# Patient Record
Sex: Male | Born: 1940 | Race: White | Hispanic: No | Marital: Married | Smoking: Former smoker
Health system: Southern US, Community
[De-identification: ages and names within clinical notes are randomized; demographics above are authoritative.]

## PROBLEM LIST (undated history)

## (undated) DIAGNOSIS — R413 Other amnesia: Principal | ICD-10-CM

## (undated) DIAGNOSIS — Z8679 Personal history of other diseases of the circulatory system: Secondary | ICD-10-CM

## (undated) DIAGNOSIS — N200 Calculus of kidney: Secondary | ICD-10-CM

## (undated) DIAGNOSIS — R918 Other nonspecific abnormal finding of lung field: Secondary | ICD-10-CM

## (undated) DIAGNOSIS — R269 Unspecified abnormalities of gait and mobility: Secondary | ICD-10-CM

## (undated) DIAGNOSIS — Z86010 Personal history of colonic polyps: Secondary | ICD-10-CM

## (undated) DIAGNOSIS — D126 Benign neoplasm of colon, unspecified: Secondary | ICD-10-CM

## (undated) HISTORY — DX: Unspecified abnormalities of gait and mobility: R26.9

## (undated) HISTORY — DX: Other amnesia: R41.3

## (undated) HISTORY — DX: Personal history of colonic polyps: Z86.010

## (undated) HISTORY — DX: Personal history of other diseases of the circulatory system: Z86.79

## (undated) HISTORY — DX: Benign neoplasm of colon, unspecified: D12.6

## (undated) HISTORY — PX: VARICOSE VEIN SURGERY: SHX832

---

## 2007-04-09 ENCOUNTER — Ambulatory Visit: Payer: Self-pay | Admitting: Internal Medicine

## 2007-04-23 ENCOUNTER — Ambulatory Visit: Payer: Self-pay | Admitting: Internal Medicine

## 2007-04-23 ENCOUNTER — Encounter: Payer: Self-pay | Admitting: Internal Medicine

## 2011-03-13 ENCOUNTER — Emergency Department (HOSPITAL_COMMUNITY)
Admission: EM | Admit: 2011-03-13 | Discharge: 2011-03-13 | Disposition: A | Discharge status: Home / Self Care | Payer: Medicare Other | Attending: Emergency Medicine | Admitting: Emergency Medicine

## 2011-03-13 ENCOUNTER — Emergency Department (HOSPITAL_COMMUNITY): Payer: Medicare Other

## 2011-03-13 DIAGNOSIS — R109 Unspecified abdominal pain: Secondary | ICD-10-CM | POA: Insufficient documentation

## 2011-03-13 DIAGNOSIS — R911 Solitary pulmonary nodule: Secondary | ICD-10-CM | POA: Insufficient documentation

## 2011-03-13 DIAGNOSIS — N200 Calculus of kidney: Secondary | ICD-10-CM | POA: Insufficient documentation

## 2011-03-13 DIAGNOSIS — N201 Calculus of ureter: Secondary | ICD-10-CM | POA: Insufficient documentation

## 2011-03-13 DIAGNOSIS — R0682 Tachypnea, not elsewhere classified: Secondary | ICD-10-CM | POA: Insufficient documentation

## 2011-03-13 LAB — URINALYSIS, ROUTINE W REFLEX MICROSCOPIC
Bilirubin Urine: NEGATIVE
Glucose, UA: NEGATIVE mg/dL
Ketones, ur: NEGATIVE mg/dL
Nitrite: NEGATIVE
Protein, ur: NEGATIVE mg/dL
Specific Gravity, Urine: 1.021 (ref 1.005–1.030)
Urobilinogen, UA: 0.2 mg/dL (ref 0.0–1.0)
pH: 7 (ref 5.0–8.0)

## 2011-03-13 LAB — URINE MICROSCOPIC-ADD ON

## 2011-03-13 LAB — POCT I-STAT, CHEM 8
BUN: 18 mg/dL (ref 6–23)
Calcium, Ion: 1.15 mmol/L (ref 1.12–1.32)
Chloride: 106 mEq/L (ref 96–112)
Creatinine, Ser: 1.2 mg/dL (ref 0.50–1.35)
Glucose, Bld: 142 mg/dL — ABNORMAL HIGH (ref 70–99)
HCT: 50 % (ref 39.0–52.0)
Hemoglobin: 17 g/dL (ref 13.0–17.0)
Potassium: 4.2 meq/L (ref 3.5–5.1)
Sodium: 142 meq/L (ref 135–145)
TCO2: 25 mmol/L (ref 0–100)

## 2011-03-14 LAB — URINE CULTURE
Colony Count: NO GROWTH
Culture  Setup Time: 201208301400
Culture: NO GROWTH

## 2011-04-28 ENCOUNTER — Telehealth: Payer: Self-pay | Admitting: Internal Medicine

## 2011-04-28 NOTE — Telephone Encounter (Signed)
Patient hasn't been seen here since 04/23/2007. Colon showed 5mm descending colon polyp, diverticulosis and internal hemorrhoids .Patient with a recent history of kidney stone.   He has not passed the kidney stone as far as his wife knows.  He developed some fever, side pain, loss of appetite, and rectal bleeding over the weekend.  Fever is low grade 100.2.  He has cloudy urine and blood in his urine also.  The blood in the stools has been a very small amount and intermittent.  The patient's wife would like him to come and see Dr. Leone Payor for all the above.  I have given him an appt for 05/02/11 8:30 with Dr. Leone Payor for rectal bleeding and he is to see his primary care in the interim to rule out non- GI causes for his other symptoms.  The patient's wife will contact his primary care Dr. Tenny Craw this am for an appt.

## 2011-05-22 ENCOUNTER — Ambulatory Visit: Payer: BLUE CROSS/BLUE SHIELD | Admitting: Internal Medicine

## 2011-11-12 DIAGNOSIS — Z8679 Personal history of other diseases of the circulatory system: Secondary | ICD-10-CM

## 2011-11-12 HISTORY — DX: Personal history of other diseases of the circulatory system: Z86.79

## 2011-11-17 ENCOUNTER — Ambulatory Visit
Admission: RE | Admit: 2011-11-17 | Discharge: 2011-11-17 | Disposition: A | Discharge status: Home / Self Care | Payer: BLUE CROSS/BLUE SHIELD | Source: Ambulatory Visit | Attending: Family Medicine | Admitting: Family Medicine

## 2011-11-17 ENCOUNTER — Other Ambulatory Visit: Payer: Self-pay | Admitting: Family Medicine

## 2011-11-17 DIAGNOSIS — R9389 Abnormal findings on diagnostic imaging of other specified body structures: Secondary | ICD-10-CM

## 2011-11-17 MED ORDER — IOHEXOL 350 MG/ML SOLN
125.0000 mL | Freq: Once | INTRAVENOUS | Status: AC | PRN
  Administered 2011-11-17: 125 mL via INTRAVENOUS

## 2011-11-18 ENCOUNTER — Other Ambulatory Visit: Payer: Self-pay | Admitting: Family Medicine

## 2011-11-18 ENCOUNTER — Ambulatory Visit
Admission: RE | Admit: 2011-11-18 | Discharge: 2011-11-18 | Disposition: A | Discharge status: Home / Self Care | Payer: Medicare Other | Source: Ambulatory Visit | Attending: Family Medicine | Admitting: Family Medicine

## 2011-11-18 DIAGNOSIS — R0781 Pleurodynia: Secondary | ICD-10-CM

## 2011-11-18 MED ORDER — IOHEXOL 300 MG/ML  SOLN
100.0000 mL | Freq: Once | INTRAMUSCULAR | Status: AC | PRN
  Administered 2011-11-18: 100 mL via INTRAVENOUS

## 2011-11-19 ENCOUNTER — Other Ambulatory Visit (HOSPITAL_COMMUNITY): Payer: Self-pay | Admitting: Family Medicine

## 2011-11-19 DIAGNOSIS — R9389 Abnormal findings on diagnostic imaging of other specified body structures: Secondary | ICD-10-CM

## 2011-11-19 DIAGNOSIS — R918 Other nonspecific abnormal finding of lung field: Secondary | ICD-10-CM

## 2011-11-21 ENCOUNTER — Emergency Department (HOSPITAL_COMMUNITY): Payer: Medicare Other

## 2011-11-21 ENCOUNTER — Inpatient Hospital Stay (HOSPITAL_COMMUNITY)
Admission: EM | Admit: 2011-11-21 | Discharge: 2011-12-10 | DRG: 542 | Disposition: A | Discharge status: Home / Self Care | Payer: Medicare Other | Attending: Pulmonary Disease | Admitting: Pulmonary Disease

## 2011-11-21 ENCOUNTER — Encounter (HOSPITAL_COMMUNITY): Payer: Self-pay | Admitting: *Deleted

## 2011-11-21 ENCOUNTER — Inpatient Hospital Stay (HOSPITAL_COMMUNITY): Payer: Medicare Other

## 2011-11-21 DIAGNOSIS — R918 Other nonspecific abnormal finding of lung field: Secondary | ICD-10-CM | POA: Diagnosis present

## 2011-11-21 DIAGNOSIS — M1710 Unilateral primary osteoarthritis, unspecified knee: Secondary | ICD-10-CM | POA: Diagnosis present

## 2011-11-21 DIAGNOSIS — D649 Anemia, unspecified: Secondary | ICD-10-CM

## 2011-11-21 DIAGNOSIS — R531 Weakness: Secondary | ICD-10-CM

## 2011-11-21 DIAGNOSIS — R5381 Other malaise: Secondary | ICD-10-CM

## 2011-11-21 DIAGNOSIS — R7309 Other abnormal glucose: Secondary | ICD-10-CM | POA: Diagnosis not present

## 2011-11-21 DIAGNOSIS — T380X5A Adverse effect of glucocorticoids and synthetic analogues, initial encounter: Secondary | ICD-10-CM | POA: Diagnosis not present

## 2011-11-21 DIAGNOSIS — R091 Pleurisy: Secondary | ICD-10-CM | POA: Diagnosis present

## 2011-11-21 DIAGNOSIS — R5383 Other fatigue: Secondary | ICD-10-CM | POA: Diagnosis present

## 2011-11-21 DIAGNOSIS — M130 Polyarthritis, unspecified: Secondary | ICD-10-CM

## 2011-11-21 DIAGNOSIS — N201 Calculus of ureter: Secondary | ICD-10-CM | POA: Diagnosis present

## 2011-11-21 DIAGNOSIS — N139 Obstructive and reflux uropathy, unspecified: Secondary | ICD-10-CM | POA: Diagnosis present

## 2011-11-21 DIAGNOSIS — M3130 Wegener's granulomatosis without renal involvement: Principal | ICD-10-CM | POA: Diagnosis present

## 2011-11-21 DIAGNOSIS — R351 Nocturia: Secondary | ICD-10-CM | POA: Diagnosis present

## 2011-11-21 DIAGNOSIS — K1370 Unspecified lesions of oral mucosa: Secondary | ICD-10-CM | POA: Diagnosis present

## 2011-11-21 DIAGNOSIS — C799 Secondary malignant neoplasm of unspecified site: Secondary | ICD-10-CM | POA: Diagnosis present

## 2011-11-21 DIAGNOSIS — E876 Hypokalemia: Secondary | ICD-10-CM | POA: Diagnosis not present

## 2011-11-21 DIAGNOSIS — J9600 Acute respiratory failure, unspecified whether with hypoxia or hypercapnia: Secondary | ICD-10-CM | POA: Diagnosis not present

## 2011-11-21 DIAGNOSIS — J95811 Postprocedural pneumothorax: Secondary | ICD-10-CM | POA: Diagnosis not present

## 2011-11-21 DIAGNOSIS — J159 Unspecified bacterial pneumonia: Secondary | ICD-10-CM

## 2011-11-21 DIAGNOSIS — Y849 Medical procedure, unspecified as the cause of abnormal reaction of the patient, or of later complication, without mention of misadventure at the time of the procedure: Secondary | ICD-10-CM | POA: Diagnosis not present

## 2011-11-21 DIAGNOSIS — E46 Unspecified protein-calorie malnutrition: Secondary | ICD-10-CM | POA: Diagnosis present

## 2011-11-21 DIAGNOSIS — K5900 Constipation, unspecified: Secondary | ICD-10-CM | POA: Diagnosis present

## 2011-11-21 DIAGNOSIS — B370 Candidal stomatitis: Secondary | ICD-10-CM | POA: Diagnosis not present

## 2011-11-21 DIAGNOSIS — D473 Essential (hemorrhagic) thrombocythemia: Secondary | ICD-10-CM | POA: Diagnosis present

## 2011-11-21 DIAGNOSIS — M2550 Pain in unspecified joint: Secondary | ICD-10-CM | POA: Diagnosis present

## 2011-11-21 DIAGNOSIS — Y9210 Unspecified residential institution as the place of occurrence of the external cause: Secondary | ICD-10-CM | POA: Diagnosis not present

## 2011-11-21 DIAGNOSIS — D72829 Elevated white blood cell count, unspecified: Secondary | ICD-10-CM | POA: Diagnosis present

## 2011-11-21 DIAGNOSIS — Z87891 Personal history of nicotine dependence: Secondary | ICD-10-CM

## 2011-11-21 DIAGNOSIS — R61 Generalized hyperhidrosis: Secondary | ICD-10-CM | POA: Diagnosis present

## 2011-11-21 DIAGNOSIS — K121 Other forms of stomatitis: Secondary | ICD-10-CM

## 2011-11-21 HISTORY — DX: Other nonspecific abnormal finding of lung field: R91.8

## 2011-11-21 HISTORY — DX: Calculus of kidney: N20.0

## 2011-11-21 LAB — CBC
HCT: 38.9 % — ABNORMAL LOW (ref 39.0–52.0)
Hemoglobin: 12.9 g/dL — ABNORMAL LOW (ref 13.0–17.0)
MCH: 29.9 pg (ref 26.0–34.0)
RBC: 4.31 MIL/uL (ref 4.22–5.81)

## 2011-11-21 LAB — URINALYSIS, ROUTINE W REFLEX MICROSCOPIC
Leukocytes, UA: NEGATIVE
Nitrite: NEGATIVE
Protein, ur: 30 mg/dL — AB

## 2011-11-21 LAB — DIFFERENTIAL
Lymphs Abs: 1 10*3/uL (ref 0.7–4.0)
Monocytes Relative: 4 % (ref 3–12)
Neutro Abs: 6.5 10*3/uL (ref 1.7–7.7)
Neutrophils Relative %: 81 % — ABNORMAL HIGH (ref 43–77)

## 2011-11-21 LAB — CARDIAC PANEL(CRET KIN+CKTOT+MB+TROPI)
Relative Index: INVALID (ref 0.0–2.5)
Total CK: 62 U/L (ref 7–232)

## 2011-11-21 LAB — COMPREHENSIVE METABOLIC PANEL
Alkaline Phosphatase: 204 U/L — ABNORMAL HIGH (ref 39–117)
BUN: 16 mg/dL (ref 6–23)
Chloride: 97 mEq/L (ref 96–112)
GFR calc Af Amer: >90 mL/min (ref >90)
GFR calc non Af Amer: 81 mL/min — ABNORMAL LOW (ref >90)
Glucose, Bld: 202 mg/dL — ABNORMAL HIGH (ref 70–99)
Potassium: 3.9 mEq/L (ref 3.5–5.1)
Total Bilirubin: 0.5 mg/dL (ref 0.3–1.2)

## 2011-11-21 LAB — URIC ACID: Uric Acid, Serum: 3.4 mg/dL — ABNORMAL LOW (ref 4.0–7.8)

## 2011-11-21 LAB — URINE MICROSCOPIC-ADD ON

## 2011-11-21 MED ORDER — ONDANSETRON HCL 4 MG/2ML IJ SOLN: 4.0000 mg | Freq: Four times a day (QID) | INTRAMUSCULAR | Status: DC | PRN

## 2011-11-21 MED ORDER — ALBUTEROL SULFATE (5 MG/ML) 0.5% IN NEBU: 2.5000 mg | INHALATION_SOLUTION | Freq: Four times a day (QID) | RESPIRATORY_TRACT | Status: DC

## 2011-11-21 MED ORDER — ENOXAPARIN SODIUM 40 MG/0.4ML ~~LOC~~ SOLN
40.0000 mg | SUBCUTANEOUS | Status: DC
  Filled 2011-11-21: qty 0.4

## 2011-11-21 MED ORDER — PIPERACILLIN-TAZOBACTAM 3.375 G IVPB
3.3750 g | Freq: Three times a day (TID) | INTRAVENOUS | Status: DC
  Administered 2011-11-21 – 2011-11-29 (×22): 3.375 g via INTRAVENOUS
  Filled 2011-11-21 (×28): qty 50

## 2011-11-21 MED ORDER — HYDROMORPHONE HCL PF 1 MG/ML IJ SOLN
1.0000 mg | Freq: Once | INTRAMUSCULAR | Status: AC
  Administered 2011-11-21: 1 mg via INTRAVENOUS
  Filled 2011-11-21: qty 1

## 2011-11-21 MED ORDER — ALBUTEROL SULFATE (5 MG/ML) 0.5% IN NEBU
2.5000 mg | INHALATION_SOLUTION | Freq: Three times a day (TID) | RESPIRATORY_TRACT | Status: DC
  Administered 2011-11-21 – 2011-11-24 (×9): 2.5 mg via RESPIRATORY_TRACT
  Filled 2011-11-21 (×9): qty 0.5

## 2011-11-21 MED ORDER — IPRATROPIUM BROMIDE 0.02 % IN SOLN
0.5000 mg | Freq: Three times a day (TID) | RESPIRATORY_TRACT | Status: DC
  Administered 2011-11-21 – 2011-11-24 (×9): 0.5 mg via RESPIRATORY_TRACT
  Filled 2011-11-21 (×9): qty 2.5

## 2011-11-21 MED ORDER — TAMSULOSIN HCL 0.4 MG PO CAPS
0.4000 mg | ORAL_CAPSULE | Freq: Every day | ORAL | Status: DC
  Administered 2011-11-21 – 2011-11-28 (×8): 0.4 mg via ORAL
  Filled 2011-11-21 (×8): qty 1

## 2011-11-21 MED ORDER — VANCOMYCIN HCL IN DEXTROSE 1-5 GM/200ML-% IV SOLN
1000.0000 mg | INTRAVENOUS | Status: AC
  Administered 2011-11-21: 1000 mg via INTRAVENOUS
  Filled 2011-11-21: qty 200

## 2011-11-21 MED ORDER — HYDROMORPHONE HCL PF 1 MG/ML IJ SOLN
0.5000 mg | INTRAMUSCULAR | Status: DC | PRN
  Administered 2011-11-21 – 2011-11-24 (×12): 0.5 mg via INTRAVENOUS
  Filled 2011-11-21 (×13): qty 1

## 2011-11-21 MED ORDER — ACETAMINOPHEN 650 MG RE SUPP: 650.0000 mg | Freq: Four times a day (QID) | RECTAL | Status: DC | PRN

## 2011-11-21 MED ORDER — ONDANSETRON HCL 4 MG PO TABS: 4.0000 mg | ORAL_TABLET | Freq: Four times a day (QID) | ORAL | Status: DC | PRN

## 2011-11-21 MED ORDER — VANCOMYCIN HCL 1000 MG IV SOLR
1250.0000 mg | Freq: Two times a day (BID) | INTRAVENOUS | Status: DC
  Administered 2011-11-21 – 2011-11-24 (×5): 1250 mg via INTRAVENOUS
  Filled 2011-11-21 (×7): qty 1250

## 2011-11-21 MED ORDER — SODIUM CHLORIDE 0.9 % IV SOLN
INTRAVENOUS | Status: DC
  Administered 2011-11-21: 999 mL via INTRAVENOUS
  Administered 2011-11-21 – 2011-11-24 (×3): via INTRAVENOUS

## 2011-11-21 MED ORDER — ALBUTEROL SULFATE HFA 108 (90 BASE) MCG/ACT IN AERS
2.0000 | INHALATION_SPRAY | Freq: Four times a day (QID) | RESPIRATORY_TRACT | Status: DC
  Filled 2011-11-21: qty 6.7

## 2011-11-21 MED ORDER — HYDROCODONE-ACETAMINOPHEN 5-325 MG PO TABS
1.0000 | ORAL_TABLET | ORAL | Status: DC | PRN
  Administered 2011-11-21 – 2011-11-23 (×9): 2 via ORAL
  Filled 2011-11-21 (×10): qty 2

## 2011-11-21 MED ORDER — DOCUSATE SODIUM 100 MG PO CAPS
100.0000 mg | ORAL_CAPSULE | Freq: Two times a day (BID) | ORAL | Status: DC
  Administered 2011-11-21 – 2011-11-28 (×10): 100 mg via ORAL
  Filled 2011-11-21 (×15): qty 1

## 2011-11-21 MED ORDER — PIPERACILLIN-TAZOBACTAM 3.375 G IVPB
3.3750 g | INTRAVENOUS | Status: AC
  Administered 2011-11-21: 3.375 g via INTRAVENOUS
  Filled 2011-11-21: qty 50

## 2011-11-21 MED ORDER — ONDANSETRON HCL 4 MG/2ML IJ SOLN
4.0000 mg | Freq: Once | INTRAMUSCULAR | Status: AC
  Administered 2011-11-21: 4 mg via INTRAVENOUS
  Filled 2011-11-21: qty 2

## 2011-11-21 MED ORDER — ENOXAPARIN SODIUM 40 MG/0.4ML ~~LOC~~ SOLN
40.0000 mg | SUBCUTANEOUS | Status: DC
  Administered 2011-11-21 – 2011-11-27 (×7): 40 mg via SUBCUTANEOUS
  Filled 2011-11-21 (×8): qty 0.4

## 2011-11-21 MED ORDER — HYDROMORPHONE HCL PF 1 MG/ML IJ SOLN
INTRAMUSCULAR | Status: AC
  Administered 2011-11-21: 0.5 mg
  Filled 2011-11-21: qty 1

## 2011-11-21 MED ORDER — ACETAMINOPHEN 325 MG PO TABS
650.0000 mg | ORAL_TABLET | Freq: Four times a day (QID) | ORAL | Status: DC | PRN
  Administered 2011-11-24: 650 mg via ORAL
  Filled 2011-11-21: qty 2

## 2011-11-21 MED ORDER — SENNA 8.6 MG PO TABS
1.0000 | ORAL_TABLET | Freq: Two times a day (BID) | ORAL | Status: DC
  Administered 2011-11-21 – 2011-12-10 (×27): 8.6 mg via ORAL
  Filled 2011-11-21 (×38): qty 1

## 2011-11-21 MED ORDER — IPRATROPIUM BROMIDE 0.02 % IN SOLN: 0.5000 mg | Freq: Four times a day (QID) | RESPIRATORY_TRACT | Status: DC

## 2011-11-21 MED ORDER — MORPHINE SULFATE 2 MG/ML IJ SOLN
2.0000 mg | Freq: Once | INTRAMUSCULAR | Status: AC
  Administered 2011-11-21: 2 mg via INTRAVENOUS
  Filled 2011-11-21: qty 1

## 2011-11-21 MED ORDER — DEXTROSE 5 % IV SOLN
100.0000 mg | Freq: Two times a day (BID) | INTRAVENOUS | Status: DC
  Administered 2011-11-21 – 2011-11-23 (×4): 100 mg via INTRAVENOUS
  Filled 2011-11-21 (×5): qty 100

## 2011-11-21 NOTE — Consult Note (Signed)
Urology Consult   Physician requesting consult: Hospital service  Reason for consult: Left-sided ureteral stone  History of Present Illness: Robert Mcgee is a 71 y.o. male who is admitted for multiple medical issues. 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 He does have a history of urolithiasis, having had a stone episode in the fall of 2011. On CT scan, the patient was found to have a left distal ureteral stone with hydronephrosis. He has not been having pain recently. He is no significant change in lower urinary tract symptomatology or gross hematuria. He is not sure if he passed a stone a year and half ago, but he was symptomatic for a few days.  He presented to the emergency room today with multiple complaints including hip pain, chest pain and myalgias/arthralgias. Evaluation included chest CT.  He denies a history of voiding or storage urinary symptoms, hematuria, UTIs, STDs, urolithiasis, GU malignancy/trauma/surgery.  Past Medical History  Diagnosis Date  . Kidney stone   . Pneumothorax     Past Surgical History  Procedure Date  . Varicose vein surgery     Medications: Scheduled Meds:   . albuterol  2.5 mg Nebulization Q6H  . docusate sodium  100 mg Oral BID  . doxycycline (VIBRAMYCIN) IV  100 mg Intravenous Q12H  . enoxaparin (LOVENOX) injection  40 mg Subcutaneous Q24H  . HYDROmorphone      . HYDROmorphone  1 mg Intravenous Once  . ipratropium  0.5 mg Nebulization Q6H  .  morphine injection  2 mg Intravenous Once  . ondansetron (ZOFRAN) IV  4 mg Intravenous Once  . piperacillin-tazobactam (ZOSYN)  IV  3.375 g Intravenous To ER  . piperacillin-tazobactam (ZOSYN)  IV  3.375 g Intravenous Q8H  . senna  1 tablet Oral BID  . vancomycin  1,250 mg Intravenous Q12H  . vancomycin  1,000 mg Intravenous To ER  . DISCONTD: albuterol  2 puff Inhalation QID  . DISCONTD: enoxaparin  40 mg Subcutaneous Q24H   Continuous Infusions:   . sodium chloride 20 mL/hr at  11/21/11 1335   PRN Meds:.acetaminophen, acetaminophen, HYDROcodone-acetaminophen, HYDROmorphone (DILAUDID) injection, ondansetron (ZOFRAN) IV, ondansetron  Allergies: No Known Allergies  History reviewed. No pertinent family history.  Social History:  reports that he quit smoking about 31 years ago. He has never used smokeless tobacco. He reports that he does not drink alcohol or use illicit drugs.  ROS: A complete review of systems was performed.  All systems are negative except for pertinent findings as noted.  Physical Exam:  Vital signs in last 24 hours: Temp:  [98.1 F (36.7 C)-98.3 F (36.8 C)] 98.3 F (36.8 C) (05/10 1637) Pulse Rate:  [76-100] 100  (05/10 1637) Resp:  [16-18] 18  (05/10 1637) BP: (128-177)/(47-80) 177/80 mmHg (05/10 1637) SpO2:  [91 %-100 %] 94 % (05/10 1637) Weight:  [97.523 kg (215 lb)] 97.523 kg (215 lb) (05/10 0913) General:  Alert and oriented, No acute distress HEENT: Normocephalic, atraumatic Neck: No JVD or lymphadenopathy  Neurologic: Grossly intact  Laboratory Data:   [facility] 11/21/11 0950  WBC 8.0  HGB 12.9*  HCT 38.9*  PLT 474*     Basename 11/21/11 0950  NA 134*  K 3.9  CL 97  GLUCOSE 202*  BUN 16  CALCIUM 9.3  CREATININE 0.97     Results for orders placed during the hospital encounter of 11/21/11 (from the past 24 hour(s))  CBC     Status: Abnormal   Collection Time  11/21/11  9:50 AM      Component Value Range   WBC 8.0  4.0 - 10.5 (K/uL)   RBC 4.31  4.22 - 5.81 (MIL/uL)   Hemoglobin 12.9 (*) 13.0 - 17.0 (g/dL)   HCT 08.6 (*) 57.8 - 52.0 (%)   MCV 90.3  78.0 - 100.0 (fL)   MCH 29.9  26.0 - 34.0 (pg)   MCHC 33.2  30.0 - 36.0 (g/dL)   RDW 46.9  62.9 - 52.8 (%)   Platelets 474 (*) 150 - 400 (K/uL)  DIFFERENTIAL     Status: Abnormal   Collection Time   11/21/11  9:50 AM      Component Value Range   Neutrophils Relative 81 (*) 43 - 77 (%)   Neutro Abs 6.5  1.7 - 7.7 (K/uL)   Lymphocytes Relative 12  12 - 46 (%)    Lymphs Abs 1.0  0.7 - 4.0 (K/uL)   Monocytes Relative 4  3 - 12 (%)   Monocytes Absolute 0.3  0.1 - 1.0 (K/uL)   Eosinophils Relative 4  0 - 5 (%)   Eosinophils Absolute 0.3  0.0 - 0.7 (K/uL)   Basophils Relative 0  0 - 1 (%)   Basophils Absolute 0.0  0.0 - 0.1 (K/uL)  COMPREHENSIVE METABOLIC PANEL     Status: Abnormal   Collection Time   11/21/11  9:50 AM      Component Value Range   Sodium 134 (*) 135 - 145 (mEq/L)   Potassium 3.9  3.5 - 5.1 (mEq/L)   Chloride 97  96 - 112 (mEq/L)   CO2 24  19 - 32 (mEq/L)   Glucose, Bld 202 (*) 70 - 99 (mg/dL)   BUN 16  6 - 23 (mg/dL)   Creatinine, Ser 4.13  0.50 - 1.35 (mg/dL)   Calcium 9.3  8.4 - 24.4 (mg/dL)   Total Protein 7.2  6.0 - 8.3 (g/dL)   Albumin 2.3 (*) 3.5 - 5.2 (g/dL)   AST 40 (*) 0 - 37 (U/L)   ALT 51  0 - 53 (U/L)   Alkaline Phosphatase 204 (*) 39 - 117 (U/L)   Total Bilirubin 0.5  0.3 - 1.2 (mg/dL)   GFR calc non Af Amer 81 (*) >90 (mL/min)   GFR calc Af Amer >90  >90 (mL/min)  TROPONIN I     Status: Normal   Collection Time   11/21/11  9:50 AM      Component Value Range   Troponin I <0.30  <0.30 (ng/mL)  SEDIMENTATION RATE     Status: Abnormal   Collection Time   11/21/11  9:50 AM      Component Value Range   Sed Rate 69 (*) 0 - 16 (mm/hr)  URINALYSIS, ROUTINE W REFLEX MICROSCOPIC     Status: Abnormal   Collection Time   11/21/11 12:08 PM      Component Value Range   Color, Urine AMBER (*) YELLOW    APPearance CLEAR  CLEAR    Specific Gravity, Urine 1.023  1.005 - 1.030    pH 6.5  5.0 - 8.0    Glucose, UA NEGATIVE  NEGATIVE (mg/dL)   Hgb urine dipstick SMALL (*) NEGATIVE    Bilirubin Urine SMALL (*) NEGATIVE    Ketones, ur TRACE (*) NEGATIVE (mg/dL)   Protein, ur 30 (*) NEGATIVE (mg/dL)   Urobilinogen, UA 1.0  0.0 - 1.0 (mg/dL)   Nitrite NEGATIVE  NEGATIVE    Leukocytes, UA NEGATIVE  NEGATIVE   URINE MICROSCOPIC-ADD ON     Status: Normal   Collection Time   11/21/11 12:08 PM      Component Value Range    WBC, UA 3-6  <3 (WBC/hpf)   RBC / HPF 3-6  <3 (RBC/hpf)   Urine-Other MUCOUS PRESENT    LACTATE DEHYDROGENASE     Status: Normal   Collection Time   11/21/11  1:30 PM      Component Value Range   LDH 181  94 - 250 (U/L)  PROTIME-INR     Status: Normal   Collection Time   11/21/11  5:00 PM      Component Value Range   Prothrombin Time 15.2  11.6 - 15.2 (seconds)   INR 1.18  0.00 - 1.49    No results found for this or any previous visit (from the past 240 hour(s)).  Renal Function:  Basename 11/21/11 0950  CREATININE 0.97   Estimated Creatinine Clearance: 81.2 ml/min (by C-G formula based on Cr of 0.97).  Radiologic Imaging: Dg Chest 2 View  11/21/2011  *RADIOLOGY REPORT*  Clinical Data: Shortness of breath for 8 days, fever, weakness, loss of appetite, former smoker  CHEST - 2 VIEW  Comparison: 11/17/2011 Correlation:  CT chest 11/17/2011  Findings: Normal heart size, mediastinal contours and pulmonary vascularity. Bronchitic changes. Interstitial changes in mid-to-lower lungs with bibasilar atelectasis. Patchy nodular foci are seen in both lungs corresponding to pulmonary nodules identified on the preceding CT. No pleural effusion or pneumothorax. Diffuse idiopathic skeletal hyperostosis. Minimal end plate spur formation thoracic spine.  IMPRESSION: Bronchitic changes with interstitial changes in the mid-to-lower lungs and coexistent bibasilar atelectasis. Bilateral pulmonary nodules, cannot exclude metastatic disease though these could also be seen with inflammatory processes or infection; consider tissue diagnosis.  Original Report Authenticated By: Lollie Marrow, M.D.   Dg Abd 1 View  11/21/2011  *RADIOLOGY REPORT*  Clinical Data: Distal left ureteral calculus, question persistence  ABDOMEN - 1 VIEW  Comparison: 11/18/2011 CT abdomen and pelvis  Findings: Retained barium throughout the colon from prior CT. 7 x 5 mm distal left ureteral calculus unchanged. No other definite urinary tract  calcifications identified. Bulky endplate spur formation throughout the lumbar spine. Small bowel gas pattern normal.  IMPRESSION: Persistent 7 x 5 mm distal left ureteral calculus.  Original Report Authenticated By: Lollie Marrow, M.D.   US Scrotum  11/21/2011  *RADIOLOGY REPORT*  Clinical Data:  Lung nodules, abdominal lymph node mass and possible testicular mass.  SCROTAL ULTRASOUND DOPPLER ULTRASOUND OF THE TESTICLES  Technique: Complete ultrasound examination of the testicles, epididymis, and other scrotal structures was performed.  Color and spectral Doppler ultrasound were also utilized to evaluate blood flow to the testicles.  Comparison:  CT of the abdomen and pelvis dated 11/18/2011  Findings:  Right testis:  4.5 x 2.3 x 2.4 cm.  Normal echotexture without evidence of focal mass.  Left testis:  4.0 x 2.1 x 2.7 cm.  Normal echotexture without focal mass.  Right epididymis:  Normal in size and appearance.  Left epididymis:  There is a benign appearing focal cyst of the left epididymis measuring 0.7 cm.  Hydrocele:  Small left hydrocele.  Varicocele:  No evidence of varicocele.  Pulsed Doppler interrogation of both testes demonstrates normal arterial inflow and venous outflow in both testicles.  IMPRESSION: No evidence of testicular mass.  Original Report Authenticated By: Reola Calkins, M.D.   [place] Art/ven Flow Abd Pelv Doppler  11/21/2011  *  RADIOLOGY REPORT*  Clinical Data:  Lung nodules, abdominal lymph node mass and possible testicular mass.  SCROTAL ULTRASOUND DOPPLER ULTRASOUND OF THE TESTICLES  Technique: Complete ultrasound examination of the testicles, epididymis, and other scrotal structures was performed.  Color and spectral Doppler ultrasound were also utilized to evaluate blood flow to the testicles.  Comparison:  CT of the abdomen and pelvis dated 11/18/2011  Findings:  Right testis:  4.5 x 2.3 x 2.4 cm.  Normal echotexture without evidence of focal mass.  Left testis:  4.0 x 2.1 x 2.7  cm.  Normal echotexture without focal mass.  Right epididymis:  Normal in size and appearance.  Left epididymis:  There is a benign appearing focal cyst of the left epididymis measuring 0.7 cm.  Hydrocele:  Small left hydrocele.  Varicocele:  No evidence of varicocele.  Pulsed Doppler interrogation of both testes demonstrates normal arterial inflow and venous outflow in both testicles.  IMPRESSION: No evidence of testicular mass.  Original Report Authenticated By: Reola Calkins, M.D.    I independently reviewed the above imaging studies.  Impression/Assessment:  7 mm left distal ureteral stone. He has minimal left hydronephrosis, and is not symptomatic. More than likely, the stone has been there quite some time  Plan:  1. At this point, I do not think we need to intervene with the stone. I think it worthwhile to start him on alpha blockers to assist with MET  2. I will follow while in the hospital, and make sure he follows up appropriately in my office. If the stone is still present in a few weeks, then it would be worthwhile to intervene with stone removal or lithotripsy.  Chelsea Aus 11/21/2011, 5:29 PM    Bertram Millard. Aliannah Holstrom MD

## 2011-11-21 NOTE — Progress Notes (Signed)
ANTIBIOTIC CONSULT NOTE - INITIAL  Pharmacy Consult for Vancomycin Indication: probable PNA, concern for metastatic dz with lung nodules.  No Known Allergies  Patient Measurements: Weight: 215 lb (97.523 kg)  Vital Signs: Temp: 98.1 F (36.7 C) (05/10 0849) Temp src: Oral (05/10 0849) BP: 161/76 mmHg (05/10 0849) Pulse Rate: 100  (05/10 0849) Intake/Output from previous day:   Intake/Output from this shift:    Labs:  Basename 11/21/11 0950  WBC 8.0  HGB 12.9*  PLT 474*  LABCREA --  CREATININE 0.97   CrCl is unknown because there is no height on file for the current visit. No results found for this basename: VANCOTROUGH:2,VANCOPEAK:2,VANCORANDOM:2,GENTTROUGH:2,GENTPEAK:2,GENTRANDOM:2,TOBRATROUGH:2,TOBRAPEAK:2,TOBRARND:2,AMIKACINPEAK:2,AMIKACINTROU:2,AMIKACIN:2, in the last 72 hours   Microbiology: No results found for this or any previous visit (from the past 720 hour(s)).  Medical History: Past Medical History  Diagnosis Date  . Kidney stone   . Pneumothorax     Medications:  Anti-infectives     Start     Dose/Rate Route Frequency Ordered Stop   11/21/11 2300   vancomycin (VANCOCIN) 1,250 mg in sodium chloride 0.9 % 250 mL IVPB        1,250 mg 166.7 mL/hr over 90 Minutes Intravenous Every 12 hours 11/21/11 1412     11/21/11 2200   piperacillin-tazobactam (ZOSYN) IVPB 3.375 g        3.375 g 12.5 mL/hr over 240 Minutes Intravenous 3 times per day 11/21/11 1349     11/21/11 1330   piperacillin-tazobactam (ZOSYN) IVPB 3.375 g        3.375 g 100 mL/hr over 30 Minutes Intravenous To Emergency Dept 11/21/11 1248 11/21/11 1405   11/21/11 1330   vancomycin (VANCOCIN) IVPB 1000 mg/200 mL premix        1,000 mg 200 mL/hr over 60 Minutes Intravenous To Emergency Dept 11/21/11 1248 11/22/11 1330         Assessment:  71 yo male with 3 wk hx of joint pain, worsening this past week. Levaquin begun Monday for respiratory symptoms x 10 days.  Recent CT noted lung  nodules, biopsy planned, not yet scheduled.  Vancomycin 1gm/Zosyn begun in ED  Goal of Therapy:  Vancomycin trough level 15-20 mcg/ml  Plan:  Vancomycin 1250 q12 after 1st dose of 1gm in ED Zosyn 3.375 gm q8h. Follow blood and urine cultures.  Otho Bellows PharmD Pager [phone] 11/21/2011,2:20 PM

## 2011-11-21 NOTE — ED Notes (Signed)
Called report to receiving rn. In a pt's room will try again. [place] in room at this time

## 2011-11-21 NOTE — Progress Notes (Signed)
Patient ID: Robert Mcgee, male   DOB: 09/05/1940, 71 y.o.   MRN: [id number]  Asked by Dr. Susie Cassette to evaluate for possible percutaneous lung biopsy.  Recent CT imaging reviewed demonstrating multiple bilateral pulmonary masses and irregular soft tissue around the left common iliac artery.  Patient to have scrotal ultrasound later today.  Labs yet to be drawn.  Recommend PET scan, if possible, for complete workup of possible malignancy.  Several of the lung lesions could be amenable for percutaneous biopsy.  PET would help to determine where the highest yield of biopsy would be.  We will follow over weekend and possibly perform lung biopsy early next week.

## 2011-11-21 NOTE — ED Notes (Signed)
Pt states still unable to provide urine sample.

## 2011-11-21 NOTE — ED Notes (Signed)
Pt states he is unable to void at this time. edmd notified

## 2011-11-21 NOTE — ED Notes (Signed)
Pt states "have been hurting since 4/23, have had 2 CT scans, was told had multiple nodules in the chest and something else showed on the abdominal/pelvis CT, all of his joints are swelling, and they know about this, they told him to come to the ED that you all would have access to all his records, the pain is the issue"

## 2011-11-21 NOTE — ED Provider Notes (Addendum)
History     CSN: [id number]  Arrival date & time 11/21/11  2725   First MD Initiated Contact with Patient 11/21/11 [phone]      Chief Complaint  Patient presents with  . Pain    (Consider location/radiation/quality/duration/timing/severity/associated sxs/prior treatment) The history is provided by the patient.  pt c/o multiple joint pains worse for past week. States symptoms initially began approximately 3 weeks ago w body aches, bil hip pain. States saw pcp and give nsaid. In past week increased pain to bil shoulders, elbows, wrists, knees, ankles. No focal joint swelling or redness. ?low grade fevers at home. No chills or sweats. Had some chest congestion, tightness, pcp had done cxr. States approoximately 1 wek ago placed on abx, levaquin, for 'haziness on chest xray' and joint pains. Since abx no better. States pcp recently did ct chest/abd/pelvis w multiple nodules, states have discussed plan for possible lung nodule biopsy, but not yet scheduled.     Past Medical History  Diagnosis Date  . Kidney stone   . Pneumothorax     Past Surgical History  Procedure Date  . Varicose vein surgery     No family history on file.  History  Substance Use Topics  . Smoking status: Former Games developer  . Smokeless tobacco: Not on file  . Alcohol Use: No      Review of Systems  Constitutional: Negative for chills.  HENT: Negative for neck pain and neck stiffness.   Eyes: Negative for discharge and redness.  Respiratory: Positive for shortness of breath. Negative for cough.   Cardiovascular: Negative for leg swelling.  Gastrointestinal: Negative for vomiting, abdominal pain and diarrhea.  Genitourinary: Negative for dysuria and flank pain.  Musculoskeletal: Negative for back pain.  Skin: Negative for rash.  Neurological: Negative for headaches.  Hematological: Does not bruise/bleed easily.  Psychiatric/Behavioral: Negative for confusion.    Allergies  Review of patient's allergies  indicates no known allergies.  Home Medications   Current Outpatient Rx  Name Route Sig Dispense Refill  . ACETAMINOPHEN 500 MG PO TABS Oral Take 500 mg by mouth every 6 (six) hours as needed. Pain    . B COMPLEX-C PO TABS Oral Take 1 tablet by mouth daily.    Marland Kitchen HYDROCODONE-ACETAMINOPHEN 5-500 MG PO TABS Oral Take 1 tablet by mouth every 6 (six) hours as needed. Pain    . IBUPROFEN 200 MG PO TABS Oral Take 200 mg by mouth every 6 (six) hours as needed. pain    . LEVOFLOXACIN 500 MG PO TABS Oral Take 500 mg by mouth daily. Pt started on Monday for 10 day supply. pts on day 4    . MULTI-VITAMIN/MINERALS PO TABS Oral Take 1 tablet by mouth daily.    Marland Kitchen FISH OIL 1200 MG PO CAPS Oral Take 1,200 mg by mouth daily.      BP 161/76  Pulse 100  Temp(Src) 98.1 F (36.7 C) (Oral)  Resp 18  Wt 215 lb (97.523 kg)  SpO2 100%  Physical Exam  Nursing note and vitals reviewed. Constitutional: He is oriented to person, place, and time. He appears well-developed and well-nourished. No distress.  HENT:  Head: Atraumatic.  Mouth/Throat: Oropharynx is clear and moist.  Eyes: Pupils are equal, round, and reactive to light.  Neck: Neck supple. No tracheal deviation present.       No stiffness or rigidity  Cardiovascular: Normal rate, regular rhythm, normal heart sounds and intact distal pulses.   Pulmonary/Chest: Effort normal. No accessory muscle  usage. No respiratory distress.       Scattered rhonchi bases  Abdominal: Soft. Bowel sounds are normal. He exhibits no distension. There is no tenderness.  Genitourinary:       No cva tenderness  Musculoskeletal: Normal range of motion.       Mild joint swelling to bil elbows/wrists/knees. No erythema. Minimal discomfort w passive rom joints.   Neurological: He is alert and oriented to person, place, and time.  Skin: Skin is warm and dry.  Psychiatric: He has a normal mood and affect.    ED Course  Procedures (including critical care time)   Labs  Reviewed  CBC  DIFFERENTIAL  COMPREHENSIVE METABOLIC PANEL  URINALYSIS, ROUTINE W REFLEX MICROSCOPIC  TROPONIN I  SEDIMENTATION RATE  CULTURE, BLOOD (ROUTINE X 2)  CULTURE, BLOOD (ROUTINE X 2)    Results for orders placed during the hospital encounter of 11/21/11  CBC      Component Value Range   WBC 8.0  4.0 - 10.5 (K/uL)   RBC 4.31  4.22 - 5.81 (MIL/uL)   Hemoglobin 12.9 (*) 13.0 - 17.0 (g/dL)   HCT 16.1 (*) 09.6 - 52.0 (%)   MCV 90.3  78.0 - 100.0 (fL)   MCH 29.9  26.0 - 34.0 (pg)   MCHC 33.2  30.0 - 36.0 (g/dL)   RDW 04.5  40.9 - 81.1 (%)   Platelets 474 (*) 150 - 400 (K/uL)  DIFFERENTIAL      Component Value Range   Neutrophils Relative 81 (*) 43 - 77 (%)   Neutro Abs 6.5  1.7 - 7.7 (K/uL)   Lymphocytes Relative 12  12 - 46 (%)   Lymphs Abs 1.0  0.7 - 4.0 (K/uL)   Monocytes Relative 4  3 - 12 (%)   Monocytes Absolute 0.3  0.1 - 1.0 (K/uL)   Eosinophils Relative 4  0 - 5 (%)   Eosinophils Absolute 0.3  0.0 - 0.7 (K/uL)   Basophils Relative 0  0 - 1 (%)   Basophils Absolute 0.0  0.0 - 0.1 (K/uL)  COMPREHENSIVE METABOLIC PANEL      Component Value Range   Sodium 134 (*) 135 - 145 (mEq/L)   Potassium 3.9  3.5 - 5.1 (mEq/L)   Chloride 97  96 - 112 (mEq/L)   CO2 24  19 - 32 (mEq/L)   Glucose, Bld 202 (*) 70 - 99 (mg/dL)   BUN 16  6 - 23 (mg/dL)   Creatinine, Ser 9.14  0.50 - 1.35 (mg/dL)   Calcium 9.3  8.4 - 78.2 (mg/dL)   Total Protein 7.2  6.0 - 8.3 (g/dL)   Albumin 2.3 (*) 3.5 - 5.2 (g/dL)   AST 40 (*) 0 - 37 (U/L)   ALT 51  0 - 53 (U/L)   Alkaline Phosphatase 204 (*) 39 - 117 (U/L)   Total Bilirubin 0.5  0.3 - 1.2 (mg/dL)   GFR calc non Af Amer 81 (*) >90 (mL/min)   GFR calc Af Amer >90  >90 (mL/min)  URINALYSIS, ROUTINE W REFLEX MICROSCOPIC      Component Value Range   Color, Urine AMBER (*) YELLOW    APPearance CLEAR  CLEAR    Specific Gravity, Urine 1.023  1.005 - 1.030    pH 6.5  5.0 - 8.0    Glucose, UA NEGATIVE  NEGATIVE (mg/dL)   Hgb urine  dipstick SMALL (*) NEGATIVE    Bilirubin Urine SMALL (*) NEGATIVE    Ketones, ur TRACE (*)  NEGATIVE (mg/dL)   Protein, ur 30 (*) NEGATIVE (mg/dL)   Urobilinogen, UA 1.0  0.0 - 1.0 (mg/dL)   Nitrite NEGATIVE  NEGATIVE    Leukocytes, UA NEGATIVE  NEGATIVE   TROPONIN I      Component Value Range   Troponin I <0.30  <0.30 (ng/mL)  SEDIMENTATION RATE      Component Value Range   Sed Rate 69 (*) 0 - 16 (mm/hr)  URINE MICROSCOPIC-ADD ON      Component Value Range   WBC, UA 3-6  <3 (WBC/hpf)   RBC / HPF 3-6  <3 (RBC/hpf)   Urine-Other MUCOUS PRESENT     Dg Chest 2 View  11/21/2011  *RADIOLOGY REPORT*  Clinical Data: Shortness of breath for 8 days, fever, weakness, loss of appetite, former smoker  CHEST - 2 VIEW  Comparison: 11/17/2011 Correlation:  CT chest 11/17/2011  Findings: Normal heart size, mediastinal contours and pulmonary vascularity. Bronchitic changes. Interstitial changes in mid-to-lower lungs with bibasilar atelectasis. Patchy nodular foci are seen in both lungs corresponding to pulmonary nodules identified on the preceding CT. No pleural effusion or pneumothorax. Diffuse idiopathic skeletal hyperostosis. Minimal end plate spur formation thoracic spine.  IMPRESSION: Bronchitic changes with interstitial changes in the mid-to-lower lungs and coexistent bibasilar atelectasis. Bilateral pulmonary nodules, cannot exclude metastatic disease though these could also be seen with inflammatory processes or infection; consider tissue diagnosis.  Original Report Authenticated By: Lollie Marrow, M.D.   Ct Angio Chest W/cm &/or Wo Cm  11/17/2011  *RADIOLOGY REPORT*  Clinical Data: Abnormal chest radiograph, fever, ex-smoker  CT ANGIOGRAPHY CHEST  Technique:  Multidetector CT imaging of the chest using the standard protocol during bolus administration of intravenous contrast. Multiplanar reconstructed images including MIPs were obtained and reviewed to evaluate the vascular anatomy.  Contrast:  OMNIPAQUE IOHEXOL 350 MG/ML SOLN  Comparison: Chest is radiograph 11/17/2011, 11/04/2011, CT abdomen 03/13/2011  BUN and creatinine were obtained on site at [facility] at [address] Results:  BUN 9 mg/dL,  Creatinine 0.8 mg/dL.  Findings: There is no filling defects within the pulmonary arteries to suggest acute pulmonary embolism.  No acute findings of the aorta great vessels.  No pericardial fluid.  Esophagus is normal.  There is  hilar and mediastinal lymphadenopathy.  For example subcarinal lymph node measures 11 mm short axis.  Right hilar/peribronchial lymph node measures 16 mm short axis (image 53).  Left hilar/peribronchial lymph node measures 16 mm (image 52.  Review of the lung parenchyma demonstrates bilateral rounded pulmonary nodules of varying size suspicious for metastasis.  Right upper lobe nodule measures 2.6 cm (image 14).  Left upper lobe nodule measures 1.8 cm (image 20 five.  Multiple peripheral nodules at the posterior right lung base measure approximately 11 mm each (image 74).  Conglomerate at the left lung base measures  2.5 cm (image 55).  8 mm right middle lobe nodule measures similar 8 mm on prior.  The lower lobe pulmonary nodules are new from prior.  Limited view of the upper abdomen demonstrates no new hepatic lesion on partially imaged liver.  Adrenal glands are normal. Review of the bone windows demonstrate no aggressive osseous lesions.  There is osteophytosis of thoracic spine.  IMPRESSION:  1.  Multiple bilateral pulmonary nodules of varying sizes concentrated in the periphery of the lobes and particularly in the lower lobes.  Differential includes multifocal infection versus multifocal metastasis.  Favor metastasis.  Recommend correlation with appropriate tumor markers and recommend  percutaneous biopsy. Bronchoscopic biopsy could also be considered. 2.  No primary malignancy on the abdomen pelvis from 03/13/2011. If the biopsy proves malignancy, recommend FDG  PET scan for further staging and primary determination 3.  No evidence of pulmonary embolism.  Original Report Authenticated By: Genevive Bi, M.D.   Ct Abdomen Pelvis W Contrast  11/18/2011  *RADIOLOGY REPORT*  Clinical Data: Lung nodules on CT eight chest, evaluate for malignancy, history of kidney stones  CT ABDOMEN AND PELVIS WITH CONTRAST  Technique:  Multidetector CT imaging of the abdomen and pelvis was performed following the standard protocol during bolus administration of intravenous contrast.  Contrast: OMNIPAQUE IOHEXOL 300 MG/ML  SOLN  Comparison: CT angio chest of 11/17/2011  Findings: As noted on CT of the chest multiple bilateral lung nodules and masses are present most consistent with diffuse lung metastases versus primary lung carcinoma with metastases. Opacities also are present in the lower lobes right greater than left suspicious for pneumonia.  The liver enhances with no focal abnormality.  There is no evidence of metastatic involvement of the liver.  Only the dome of the liver is slightly inhomogeneous.  No calcified gallstones are seen.  The pancreas is normal in size and the pancreatic duct is not dilated.  The adrenal glands and spleen are unremarkable.  The stomach is moderately fluid distended with no abnormality noted.  The kidneys enhance and there is moderate left hydronephrosis and left hydroureter present. The left ureter remains dilated to the pelvis where there is a partially obstructing 6 x 7 mm calculus near the left UV junction.  The right ureter is normal in caliber.  However, there is abnormal soft tissue surrounding the bifurcation of the abdominal aorta into the iliac arteries and coursing along the left common iliac artery to its bifurcation.  Adenopathy is the primary consideration and lymphoma would be within the differential diagnostic list.  Metastatic disease would also be a consideration. The scrotum was not included on the present study but testicular  carcinoma would be a consideration and clinical correlation is recommended.  There are multiple rectosigmoid colonic diverticula present with diverticula scattered throughout the remainder of the colon as well.  No colonic lesion is seen.  The terminal ileum and appendix are unremarkable.  Diffuse degenerative changes are present throughout the lumbar spine.  IMPRESSION:  1.  Abnormal soft tissue mass surrounds the aortic bifurcation into the iliac arteries and courses along the left common iliac artery most consistent with adenopathy.  Consider lymphoma versus possible metastatic disease such as testicular carcinoma and clinical correlation is recommended. 2.  Multiple lung nodules and masses consistent with either metastatic involvement of the lungs or primary lung carcinoma with metastases. 3.  Moderate left hydronephrosis and hydroureter to a point of obstruction by a 6 x 7 mm left ureteral calculus near the left UV junction. 4.  Parenchymal opacities in the lower lobes right greater than left suspicious for pneumonia.  Original Report Authenticated By: Juline Patch, M.D.      MDM  Iv ns. Labs. Requests pain med, dilaudid 1 mg iv. zofran iv.  Reviewed nursing notes, and recent cts.   Pt with multiple pulm nodules on recent ct, ct lists diff metastatic dis vs atypical infxn/pna - given acute onset symptoms, multiple joint pains/swelling (?bacteremia), subjective fever, will rx antibiotic for possible bacteremia/pna.  Pt already txd w outpt abx for cap, no better/worse - therefore will rx zosyn and vanc (cultures already sent).   Zosyn  and vanc iv.   Recheck pt - requests additional pain med, but 'not as strong as first dose' - morphine iv.   Med service called to admit.   Also discussed recent ct dx ureteral stone/hydro w pt - pt states prior to onset current symptoms did have low back pain, cant lateralize, denies back/flank pain currently.  Will check kub. May benefit from urologist  consultation if persistent ureteral stone/recurrent pain.        Suzi Roots, MD 11/21/11 1247  Suzi Roots, MD 11/21/11 1249

## 2011-11-21 NOTE — ED Notes (Signed)
Patient transported to X-ray 

## 2011-11-21 NOTE — ED Notes (Signed)
Admitting md in room. Called [facility] to try to schedule pt biospsy today

## 2011-11-21 NOTE — H&P (Addendum)
Robert Mcgee MRN: [id number] DOB/AGE: 12-31-1940 71 y.o. Primary Care Physician:No primary provider on file. Admit date: 11/21/2011  Chief Complaint: arthralgias , weakness HPI: 71 yr old pt c/o multiple joint pains worse for past week. States symptoms initially began approximately 3 weeks ago w body aches, bil hip pain. Also complains of pleuritic chest pain. He saw his primary care provider a week ago who prescribed levofloxacin. He has noticed low-grade fever at home. He is also noticed migratory polyarthralgia's with erythema pain and swelling in multiple joints., He has lost about 5-6 pounds in the last 3 weeks. He denies any nausea vomiting abdominal pain. He denies any rash. He denies any headache blurry vision or neck stiffness.  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 He has experienced low grade fevers at home with temperature up to 100F. After taking course of one week of by mouth Levaquin, he has noticed some improvement in his chest pain after a week of antibiotics .He has had multiple joint pain ,arthralgias .  His  pcp recently did CT  chest/abd/pelvis w multiple nodules, states have discussed plan for possible lung nodule biopsy.   Past Medical History  Diagnosis Date  . Kidney stone   . Pneumothorax     Past Surgical History  Procedure Date  . Varicose vein surgery     Prior to Admission medications   Medication Sig Start Date End Date Taking? Authorizing Provider  acetaminophen (TYLENOL) 500 MG tablet Take 500 mg by mouth every 6 (six) hours as needed. Pain   Yes Historical Provider, MD  B Complex-C (B-COMPLEX WITH VITAMIN C) tablet Take 1 tablet by mouth daily.   Yes Historical Provider, MD  HYDROcodone-acetaminophen (VICODIN) 5-500 MG per tablet Take 1 tablet by mouth every 6 (six) hours as needed. Pain   Yes Historical Provider, MD  ibuprofen (ADVIL,MOTRIN) 200 MG tablet Take 200 mg by  mouth every 6 (six) hours as needed. pain   Yes Historical Provider, MD  levofloxacin (LEVAQUIN) 500 MG tablet Take 500 mg by mouth daily. Pt started on Monday for 10 day supply. pts on day 4   Yes Historical Provider, MD  Multiple Vitamins-Minerals (MULTIVITAMIN WITH MINERALS) tablet Take 1 tablet by mouth daily.   Yes Historical Provider, MD  Omega-3 Fatty Acids (FISH OIL) 1200 MG CAPS Take 1,200 mg by mouth daily.   Yes Historical Provider, MD    Allergies: No Known Allergies  History reviewed. No pertinent family history.  Social History:  reports that he quit smoking about 31 years ago. He has never used smokeless tobacco. He reports that he does not drink alcohol or use illicit drugs.         ROS: Review of Systems  Constitutional: Negative for chills.  HENT: Negative for neck pain and neck stiffness.  Eyes: Negative for discharge and redness.  Respiratory: Positive for shortness of breath. Negative for cough.  Cardiovascular: Negative for leg swelling.  Gastrointestinal: Negative for vomiting, abdominal pain and diarrhea.  Genitourinary: Negative for dysuria and flank pain.  Musculoskeletal: Negative for back pain. Wrist pain,  Skin: Negative for rash.  Neurological: Negative for headaches.  Hematological: Does not bruise/bleed easily.  Psychiatric/Behavioral: Negative for confusion.     PHYSICAL EXAM: Blood pressure 128/47, pulse 83, temperature 98.1 F (36.7 C), temperature source Oral, resp. rate 16, weight 97.523 kg (215 lb), SpO2 91.00%.  Constitutional: He is oriented  to person, place, and time. He appears well-developed and well-nourished. No distress.  HENT:  Head: Atraumatic.  Mouth/Throat: Oropharynx is clear and moist.  Eyes: Pupils are equal, round, and reactive to light.  Neck: Neck supple. No tracheal deviation present.  No stiffness or rigidity  Cardiovascular: Normal rate, regular rhythm, normal heart sounds and intact distal pulses.    Pulmonary/Chest: Effort normal. No accessory muscle usage. No respiratory distress.  Scattered rhonchi bases  Abdominal: Soft. Bowel sounds are normal. He exhibits no distension. There is no tenderness.  Genitourinary:  No cva tenderness  Musculoskeletal: Normal range of motion.  Mild joint swelling to bil elbows/wrists/knees. No erythema. Minimal discomfort w passive rom joints.  Neurological: He is alert and oriented to person, place, and time.  Skin: Skin is warm and dry.  Psychiatric: He has a normal mood and affect.    No results found for this or any previous visit (from the past 240 hour(s)).   Results for orders placed during the hospital encounter of 11/21/11 (from the past 48 hour(s))  CBC     Status: Abnormal   Collection Time   11/21/11  9:50 AM      Component Value Range Comment   WBC 8.0  4.0 - 10.5 (K/uL)    RBC 4.31  4.22 - 5.81 (MIL/uL)    Hemoglobin 12.9 (*) 13.0 - 17.0 (g/dL)    HCT 16.1 (*) 09.6 - 52.0 (%)    MCV 90.3  78.0 - 100.0 (fL)    MCH 29.9  26.0 - 34.0 (pg)    MCHC 33.2  30.0 - 36.0 (g/dL)    RDW 04.5  40.9 - 81.1 (%)    Platelets 474 (*) 150 - 400 (K/uL)   DIFFERENTIAL     Status: Abnormal   Collection Time   11/21/11  9:50 AM      Component Value Range Comment   Neutrophils Relative 81 (*) 43 - 77 (%)    Neutro Abs 6.5  1.7 - 7.7 (K/uL)    Lymphocytes Relative 12  12 - 46 (%)    Lymphs Abs 1.0  0.7 - 4.0 (K/uL)    Monocytes Relative 4  3 - 12 (%)    Monocytes Absolute 0.3  0.1 - 1.0 (K/uL)    Eosinophils Relative 4  0 - 5 (%)    Eosinophils Absolute 0.3  0.0 - 0.7 (K/uL)    Basophils Relative 0  0 - 1 (%)    Basophils Absolute 0.0  0.0 - 0.1 (K/uL)   COMPREHENSIVE METABOLIC PANEL     Status: Abnormal   Collection Time   11/21/11  9:50 AM      Component Value Range Comment   Sodium 134 (*) 135 - 145 (mEq/L)    Potassium 3.9  3.5 - 5.1 (mEq/L)    Chloride 97  96 - 112 (mEq/L)    CO2 24  19 - 32 (mEq/L)    Glucose, Bld 202 (*) 70 - 99  (mg/dL)    BUN 16  6 - 23 (mg/dL)    Creatinine, Ser 9.14  0.50 - 1.35 (mg/dL)    Calcium 9.3  8.4 - 10.5 (mg/dL)    Total Protein 7.2  6.0 - 8.3 (g/dL)    Albumin 2.3 (*) 3.5 - 5.2 (g/dL)    AST 40 (*) 0 - 37 (U/L)    ALT 51  0 - 53 (U/L)    Alkaline Phosphatase 204 (*) 39 - 117 (U/L)  Total Bilirubin 0.5  0.3 - 1.2 (mg/dL)    GFR calc non Af Amer 81 (*) >90 (mL/min)    GFR calc Af Amer >90  >90 (mL/min)   TROPONIN I     Status: Normal   Collection Time   11/21/11  9:50 AM      Component Value Range Comment   Troponin I <0.30  <0.30 (ng/mL)   SEDIMENTATION RATE     Status: Abnormal   Collection Time   11/21/11  9:50 AM      Component Value Range Comment   Sed Rate 69 (*) 0 - 16 (mm/hr)   URINALYSIS, ROUTINE W REFLEX MICROSCOPIC     Status: Abnormal   Collection Time   11/21/11 12:08 PM      Component Value Range Comment   Color, Urine AMBER (*) YELLOW  BIOCHEMICALS MAY BE AFFECTED BY COLOR   APPearance CLEAR  CLEAR     Specific Gravity, Urine 1.023  1.005 - 1.030     pH 6.5  5.0 - 8.0     Glucose, UA NEGATIVE  NEGATIVE (mg/dL)    Hgb urine dipstick SMALL (*) NEGATIVE     Bilirubin Urine SMALL (*) NEGATIVE     Ketones, ur TRACE (*) NEGATIVE (mg/dL)    Protein, ur 30 (*) NEGATIVE (mg/dL)    Urobilinogen, UA 1.0  0.0 - 1.0 (mg/dL)    Nitrite NEGATIVE  NEGATIVE     Leukocytes, UA NEGATIVE  NEGATIVE    URINE MICROSCOPIC-ADD ON     Status: Normal   Collection Time   11/21/11 12:08 PM      Component Value Range Comment   WBC, UA 3-6  <3 (WBC/hpf)    RBC / HPF 3-6  <3 (RBC/hpf)    Urine-Other MUCOUS PRESENT     LACTATE DEHYDROGENASE     Status: Normal   Collection Time   11/21/11  1:30 PM      Component Value Range Comment   LDH 181  94 - 250 (U/L)     Dg Chest 2 View  11/21/2011  *RADIOLOGY REPORT*  Clinical Data: Shortness of breath for 8 days, fever, weakness, loss of appetite, former smoker  CHEST - 2 VIEW  Comparison: 11/17/2011 Correlation:  CT chest 11/17/2011   Findings: Normal heart size, mediastinal contours and pulmonary vascularity. Bronchitic changes. Interstitial changes in mid-to-lower lungs with bibasilar atelectasis. Patchy nodular foci are seen in both lungs corresponding to pulmonary nodules identified on the preceding CT. No pleural effusion or pneumothorax. Diffuse idiopathic skeletal hyperostosis. Minimal end plate spur formation thoracic spine.  IMPRESSION: Bronchitic changes with interstitial changes in the mid-to-lower lungs and coexistent bibasilar atelectasis. Bilateral pulmonary nodules, cannot exclude metastatic disease though these could also be seen with inflammatory processes or infection; consider tissue diagnosis.  Original Report Authenticated By: Lollie Marrow, M.D.   Dg Abd 1 View  11/21/2011  *RADIOLOGY REPORT*  Clinical Data: Distal left ureteral calculus, question persistence  ABDOMEN - 1 VIEW  Comparison: 11/18/2011 CT abdomen and pelvis  Findings: Retained barium throughout the colon from prior CT. 7 x 5 mm distal left ureteral calculus unchanged. No other definite urinary tract calcifications identified. Bulky endplate spur formation throughout the lumbar spine. Small bowel gas pattern normal.  IMPRESSION: Persistent 7 x 5 mm distal left ureteral calculus.  Original Report Authenticated By: Lollie Marrow, M.D.   Ct Angio Chest W/cm &/or Wo Cm  11/17/2011  *RADIOLOGY REPORT*  Clinical Data: Abnormal chest radiograph,  fever, ex-smoker  CT ANGIOGRAPHY CHEST  Technique:  Multidetector CT imaging of the chest using the standard protocol during bolus administration of intravenous contrast. Multiplanar reconstructed images including MIPs were obtained and reviewed to evaluate the vascular anatomy.  Contrast: OMNIPAQUE IOHEXOL 350 MG/ML SOLN  Comparison: Chest is radiograph 11/17/2011, 11/04/2011, CT abdomen 03/13/2011  BUN and creatinine were obtained on site at [facility] at [address] Results:  BUN 9 mg/dL,   Creatinine 0.8 mg/dL.  Findings: There is no filling defects within the pulmonary arteries to suggest acute pulmonary embolism.  No acute findings of the aorta great vessels.  No pericardial fluid.  Esophagus is normal.  There is  hilar and mediastinal lymphadenopathy.  For example subcarinal lymph node measures 11 mm short axis.  Right hilar/peribronchial lymph node measures 16 mm short axis (image 53).  Left hilar/peribronchial lymph node measures 16 mm (image 52.  Review of the lung parenchyma demonstrates bilateral rounded pulmonary nodules of varying size suspicious for metastasis.  Right upper lobe nodule measures 2.6 cm (image 14).  Left upper lobe nodule measures 1.8 cm (image 20 five.  Multiple peripheral nodules at the posterior right lung base measure approximately 11 mm each (image 74).  Conglomerate at the left lung base measures  2.5 cm (image 55).  8 mm right middle lobe nodule measures similar 8 mm on prior.  The lower lobe pulmonary nodules are new from prior.  Limited view of the upper abdomen demonstrates no new hepatic lesion on partially imaged liver.  Adrenal glands are normal. Review of the bone windows demonstrate no aggressive osseous lesions.  There is osteophytosis of thoracic spine.  IMPRESSION:  1.  Multiple bilateral pulmonary nodules of varying sizes concentrated in the periphery of the lobes and particularly in the lower lobes.  Differential includes multifocal infection versus multifocal metastasis.  Favor metastasis.  Recommend correlation with appropriate tumor markers and recommend percutaneous biopsy. Bronchoscopic biopsy could also be considered. 2.  No primary malignancy on the abdomen pelvis from 03/13/2011. If the biopsy proves malignancy, recommend FDG PET scan for further staging and primary determination 3.  No evidence of pulmonary embolism.  Original Report Authenticated By: Genevive Bi, M.D.   Ct Abdomen Pelvis W Contrast  11/18/2011  *RADIOLOGY REPORT*  Clinical  Data: Lung nodules on CT eight chest, evaluate for malignancy, history of kidney stones  CT ABDOMEN AND PELVIS WITH CONTRAST  Technique:  Multidetector CT imaging of the abdomen and pelvis was performed following the standard protocol during bolus administration of intravenous contrast.  Contrast: OMNIPAQUE IOHEXOL 300 MG/ML  SOLN  Comparison: CT angio chest of 11/17/2011  Findings: As noted on CT of the chest multiple bilateral lung nodules and masses are present most consistent with diffuse lung metastases versus primary lung carcinoma with metastases. Opacities also are present in the lower lobes right greater than left suspicious for pneumonia.  The liver enhances with no focal abnormality.  There is no evidence of metastatic involvement of the liver.  Only the dome of the liver is slightly inhomogeneous.  No calcified gallstones are seen.  The pancreas is normal in size and the pancreatic duct is not dilated.  The adrenal glands and spleen are unremarkable.  The stomach is moderately fluid distended with no abnormality noted.  The kidneys enhance and there is moderate left hydronephrosis and left hydroureter present. The left ureter remains dilated to the pelvis where there is a partially obstructing 6 x 7 mm calculus  near the left UV junction.  The right ureter is normal in caliber.  However, there is abnormal soft tissue surrounding the bifurcation of the abdominal aorta into the iliac arteries and coursing along the left common iliac artery to its bifurcation.  Adenopathy is the primary consideration and lymphoma would be within the differential diagnostic list.  Metastatic disease would also be a consideration. The scrotum was not included on the present study but testicular carcinoma would be a consideration and clinical correlation is recommended.  There are multiple rectosigmoid colonic diverticula present with diverticula scattered throughout the remainder of the colon as well.  No colonic lesion  is seen.  The terminal ileum and appendix are unremarkable.  Diffuse degenerative changes are present throughout the lumbar spine.  IMPRESSION:  1.  Abnormal soft tissue mass surrounds the aortic bifurcation into the iliac arteries and courses along the left common iliac artery most consistent with adenopathy.  Consider lymphoma versus possible metastatic disease such as testicular carcinoma and clinical correlation is recommended. 2.  Multiple lung nodules and masses consistent with either metastatic involvement of the lungs or primary lung carcinoma with metastases. 3.  Moderate left hydronephrosis and hydroureter to a point of obstruction by a 6 x 7 mm left ureteral calculus near the left UV junction. 4.  Parenchymal opacities in the lower lobes right greater than left suspicious for pneumonia.  Original Report Authenticated By: Juline Patch, M.D.    Impression:  Principal Problem:  *Metastasis of unknown primary Active Problems:  Weakness generalized  Pulmonary nodule Obstructive uropathy Migratory polyarthralgias    Plan: 1. Admit to tele, cycle cardiac enzymes  2. Work up for polyarthralgia - lyme titer, ANA, hepatitis panle, uric acid,ESR ,  3.urology consult for left sided hydronephrosis 4. Lyme ,RMSF, titer , possible ID consult in am  5. Empiric antibiotics ,vanc , zosyn, doxycycline 6. Full code  7. Pulmonary nodules,CT guided BX anticipated but first ultrasound of scrotum to evaluate testicular mass, possible PET CT monday #8 2-D echo to rule out endocarditis      [facility] 11/21/2011, 3:25 PM

## 2011-11-22 ENCOUNTER — Inpatient Hospital Stay (HOSPITAL_COMMUNITY): Payer: Medicare Other

## 2011-11-22 DIAGNOSIS — M130 Polyarthritis, unspecified: Secondary | ICD-10-CM | POA: Diagnosis present

## 2011-11-22 DIAGNOSIS — D649 Anemia, unspecified: Secondary | ICD-10-CM | POA: Diagnosis present

## 2011-11-22 DIAGNOSIS — R61 Generalized hyperhidrosis: Secondary | ICD-10-CM | POA: Diagnosis present

## 2011-11-22 DIAGNOSIS — R911 Solitary pulmonary nodule: Secondary | ICD-10-CM

## 2011-11-22 DIAGNOSIS — R5383 Other fatigue: Secondary | ICD-10-CM

## 2011-11-22 DIAGNOSIS — I359 Nonrheumatic aortic valve disorder, unspecified: Secondary | ICD-10-CM

## 2011-11-22 DIAGNOSIS — J159 Unspecified bacterial pneumonia: Secondary | ICD-10-CM

## 2011-11-22 DIAGNOSIS — K121 Other forms of stomatitis: Secondary | ICD-10-CM | POA: Diagnosis present

## 2011-11-22 DIAGNOSIS — R5381 Other malaise: Secondary | ICD-10-CM

## 2011-11-22 LAB — SYNOVIAL CELL COUNT + DIFF, W/ CRYSTALS
Eosinophils-Synovial: 0 % (ref 0–1)
Lymphocytes-Synovial Fld: 22 % — ABNORMAL HIGH (ref 0–20)
Monocyte-Macrophage-Synovial Fluid: 9 % — ABNORMAL LOW (ref 50–90)
Neutrophil, Synovial: 69 % — ABNORMAL HIGH (ref 0–25)

## 2011-11-22 LAB — COMPREHENSIVE METABOLIC PANEL
AST: 30 U/L (ref 0–37)
CO2: 26 mEq/L (ref 19–32)
Calcium: 8.7 mg/dL (ref 8.4–10.5)
Creatinine, Ser: 1.07 mg/dL (ref 0.50–1.35)
GFR calc non Af Amer: 68 mL/min — ABNORMAL LOW (ref >90)
Total Protein: 6.3 g/dL (ref 6.0–8.3)

## 2011-11-22 LAB — CARDIAC PANEL(CRET KIN+CKTOT+MB+TROPI)
CK, MB: 1.9 ng/mL (ref 0.3–4.0)
Relative Index: INVALID (ref 0.0–2.5)
Relative Index: INVALID (ref 0.0–2.5)
Total CK: 41 U/L (ref 7–232)
Total CK: 44 U/L (ref 7–232)
Troponin I: <0.3 ng/mL (ref <0.30)
Troponin I: <0.3 ng/mL (ref <0.30)

## 2011-11-22 LAB — CBC
Hemoglobin: 11.9 g/dL — ABNORMAL LOW (ref 13.0–17.0)
MCH: 30.2 pg (ref 26.0–34.0)
RBC: 3.94 MIL/uL — ABNORMAL LOW (ref 4.22–5.81)

## 2011-11-22 LAB — RHEUMATOID FACTOR: Rhuematoid fact SerPl-aCnc: 18 IU/mL — ABNORMAL HIGH (ref <=14)

## 2011-11-22 MED ORDER — DIPHENHYDRAMINE HCL 50 MG/ML IJ SOLN
25.0000 mg | Freq: Four times a day (QID) | INTRAMUSCULAR | Status: DC | PRN
  Administered 2011-11-22 – 2011-11-23 (×2): 25 mg via INTRAVENOUS
  Filled 2011-11-22 (×3): qty 1

## 2011-11-22 MED ORDER — LIDOCAINE-EPINEPHRINE 1 %-1:100000 IJ SOLN
10.0000 mL | Freq: Once | INTRAMUSCULAR | Status: AC
  Administered 2011-11-22: 10 mL
  Filled 2011-11-22: qty 10

## 2011-11-22 NOTE — Consult Note (Signed)
Reason for Consult:Multiple joint pain and swelling Referring Physician:[facility]  Robert Mcgee is an 71 y.o. male.  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 All records and notes from previous cement from the hospital system. It was initiated tick bite right posterior shoulder and possibly 3 weeks prior to this. He is currently admitted for workup of pulmonary nodules periaortic inflammatory process inlet metastatic disease versus infectious process. Infectious disease is seeing Robert Mcgee way and recommended joint arthrocentesis.  Patient and his wife states that since been placed on IV antibiotics his joints are feeling better he was having bilateral elbow shoulder wrist pain bilateral knee pain and ankle pain. Due to his a history of right ankle fracture in the past there has been some intermittent pain there. In addition he Has a chronic history of intermittent knee catching. Day major complaint is left and right knee pain. Does not have much ankle pain wrist elbow shoulder or hip.  Past Medical History  Diagnosis Date  . Kidney stone   . Pneumothorax     Past Surgical History  Procedure Date  . Varicose vein surgery     History reviewed. No pertinent family history.  Social History:  reports that he quit smoking about 31 years ago. He has never used smokeless tobacco. He reports that he does not drink alcohol or use illicit drugs.  Allergies: No Known Allergies  Medications: I have reviewed the patient's current medications.  Results for orders placed during the hospital encounter of 11/21/11 (from the past 48 hour(s))  CBC     Status: Abnormal   Collection Time   11/21/11  9:50 AM      Component Value Range Comment   WBC 8.0  4.0 - 10.5 (K/uL)    RBC 4.31  4.22 - 5.81 (MIL/uL)    Hemoglobin 12.9 (*) 13.0 - 17.0 (g/dL)    HCT 96.0 (*) 45.4 - 52.0 (%)    MCV 90.3  78.0 - 100.0 (fL)    MCH 29.9  26.0 - 34.0 (pg)    MCHC 33.2  30.0 - 36.0 (g/dL)    RDW 09.8  11.9 - 14.7 (%)    Platelets 474 (*) 150 - 400 (K/uL)   DIFFERENTIAL     Status: Abnormal   Collection Time   11/21/11  9:50 AM      Component Value Range Comment   Neutrophils Relative 81 (*) 43 - 77 (%)    Neutro Abs 6.5  1.7 - 7.7 (K/uL)    Lymphocytes Relative 12  12 - 46 (%)    Lymphs Abs 1.0  0.7 - 4.0 (K/uL)    Monocytes Relative 4  3 - 12 (%)    Monocytes Absolute 0.3  0.1 - 1.0 (K/uL)    Eosinophils Relative 4  0 - 5 (%)    Eosinophils Absolute 0.3  0.0 - 0.7 (K/uL)    Basophils Relative 0  0 - 1 (%)    Basophils Absolute 0.0  0.0 - 0.1 (K/uL)   COMPREHENSIVE METABOLIC PANEL     Status: Abnormal   Collection Time   11/21/11  9:50 AM      Component Value Range Comment   Sodium 134 (*) 135 - 145 (mEq/L)    Potassium 3.9  3.5 - 5.1 (mEq/L)    Chloride 97  96 - 112 (mEq/L)    CO2 24  19 -  32 (mEq/L)    Glucose, Bld 202 (*) 70 - 99 (mg/dL)    BUN 16  6 - 23 (mg/dL)    Creatinine, Ser 1.61  0.50 - 1.35 (mg/dL)    Calcium 9.3  8.4 - 10.5 (mg/dL)    Total Protein 7.2  6.0 - 8.3 (g/dL)    Albumin 2.3 (*) 3.5 - 5.2 (g/dL)    AST 40 (*) 0 - 37 (U/L)    ALT 51  0 - 53 (U/L)    Alkaline Phosphatase 204 (*) 39 - 117 (U/L)    Total Bilirubin 0.5  0.3 - 1.2 (mg/dL)    GFR calc non Af Amer 81 (*) >90 (mL/min)    GFR calc Af Amer >90  >90 (mL/min)   TROPONIN I     Status: Normal   Collection Time   11/21/11  9:50 AM      Component Value Range Comment   Troponin I <0.30  <0.30 (ng/mL)   SEDIMENTATION RATE     Status: Abnormal   Collection Time   11/21/11  9:50 AM      Component Value Range Comment   Sed Rate 69 (*) 0 - 16 (mm/hr)   CULTURE, BLOOD (ROUTINE X 2)     Status: Normal (Preliminary result)   Collection Time   11/21/11  9:50 AM      Component Value Range Comment   Specimen Description BLOOD RIGHT ANTECUBITAL      Special Requests BOTTLES DRAWN AEROBIC AND ANAEROBIC       Culture  Setup Time [id number]      Culture        Value:        BLOOD CULTURE RECEIVED NO GROWTH TO DATE CULTURE WILL BE HELD FOR 5 DAYS BEFORE ISSUING A FINAL NEGATIVE REPORT   Report Status PENDING     CULTURE, BLOOD (ROUTINE X 2)     Status: Normal (Preliminary result)   Collection Time   11/21/11  9:50 AM      Component Value Range Comment   Specimen Description BLOOD LEFT FOREARM      Special Requests BOTTLES DRAWN AEROBIC AND ANAEROBIC      Culture  Setup Time [id number]      Culture        Value:        BLOOD CULTURE RECEIVED NO GROWTH TO DATE CULTURE WILL BE HELD FOR 5 DAYS BEFORE ISSUING A FINAL NEGATIVE REPORT   Report Status PENDING     URINALYSIS, ROUTINE W REFLEX MICROSCOPIC     Status: Abnormal   Collection Time   11/21/11 12:08 PM      Component Value Range Comment   Color, Urine AMBER (*) YELLOW  BIOCHEMICALS MAY BE AFFECTED BY COLOR   APPearance CLEAR  CLEAR     Specific Gravity, Urine 1.023  1.005 - 1.030     pH 6.5  5.0 - 8.0     Glucose, UA NEGATIVE  NEGATIVE (mg/dL)    Hgb urine dipstick SMALL (*) NEGATIVE     Bilirubin Urine SMALL (*) NEGATIVE     Ketones, ur TRACE (*) NEGATIVE (mg/dL)    Protein, ur 30 (*) NEGATIVE (mg/dL)    Urobilinogen, UA 1.0  0.0 - 1.0 (mg/dL)    Nitrite NEGATIVE  NEGATIVE     Leukocytes, UA NEGATIVE  NEGATIVE    URINE MICROSCOPIC-ADD ON     Status: Normal   Collection Time   11/21/11 12:08 PM  Component Value Range Comment   WBC, UA 3-6  <3 (WBC/hpf)    RBC / HPF 3-6  <3 (RBC/hpf)    Urine-Other MUCOUS PRESENT     LACTATE DEHYDROGENASE     Status: Normal   Collection Time   11/21/11  1:30 PM      Component Value Range Comment   LDH 181  94 - 250 (U/L)   URIC ACID     Status: Abnormal   Collection Time   11/21/11  5:00 PM      Component Value Range Comment   Uric Acid, Serum 3.4 (*) 4.0 - 7.8 (mg/dL)   PROTIME-INR     Status: Normal   Collection Time   11/21/11  5:00 PM      Component Value Range Comment     Prothrombin Time 15.2  11.6 - 15.2 (seconds)    INR 1.18  0.00 - 1.49    TSH     Status: Abnormal   Collection Time   11/21/11  5:00 PM      Component Value Range Comment   TSH 6.679 (*) 0.350 - 4.500 (uIU/mL)   CARDIAC PANEL(CRET KIN+CKTOT+MB+TROPI)     Status: Normal   Collection Time   11/21/11  5:00 PM      Component Value Range Comment   Total CK 62  7 - 232 (U/L)    CK, MB 2.9  0.3 - 4.0 (ng/mL)    Troponin I <0.30  <0.30 (ng/mL)    Relative Index RELATIVE INDEX IS INVALID  0.0 - 2.5    RHEUMATOID FACTOR     Status: Abnormal   Collection Time   11/21/11  6:30 PM      Component Value Range Comment   Rheumatoid Factor 18 (*) <=14 (IU/mL)   CARDIAC PANEL(CRET KIN+CKTOT+MB+TROPI)     Status: Normal   Collection Time   11/22/11 12:20 AM      Component Value Range Comment   Total CK 41  7 - 232 (U/L)    CK, MB 1.9  0.3 - 4.0 (ng/mL)    Troponin I <0.30  <0.30 (ng/mL)    Relative Index RELATIVE INDEX IS INVALID  0.0 - 2.5    COMPREHENSIVE METABOLIC PANEL     Status: Abnormal   Collection Time   11/22/11  5:20 AM      Component Value Range Comment   Sodium 136  135 - 145 (mEq/L)    Potassium 3.8  3.5 - 5.1 (mEq/L)    Chloride 100  96 - 112 (mEq/L)    CO2 26  19 - 32 (mEq/L)    Glucose, Bld 140 (*) 70 - 99 (mg/dL)    BUN 11  6 - 23 (mg/dL)    Creatinine, Ser 4.09  0.50 - 1.35 (mg/dL)    Calcium 8.7  8.4 - 10.5 (mg/dL)    Total Protein 6.3  6.0 - 8.3 (g/dL)    Albumin 1.9 (*) 3.5 - 5.2 (g/dL)    AST 30  0 - 37 (U/L)    ALT 38  0 - 53 (U/L)    Alkaline Phosphatase 192 (*) 39 - 117 (U/L)    Total Bilirubin 0.5  0.3 - 1.2 (mg/dL)    GFR calc non Af Amer 68 (*) >90 (mL/min)    GFR calc Af Amer 79 (*) >90 (mL/min)   CBC     Status: Abnormal   Collection Time   11/22/11  5:20 AM  Component Value Range Comment   WBC 9.2  4.0 - 10.5 (K/uL)    RBC 3.94 (*) 4.22 - 5.81 (MIL/uL)    Hemoglobin 11.9 (*) 13.0 - 17.0 (g/dL)    HCT 13.0 (*) 86.5 - 52.0 (%)    MCV 91.4  78.0 -  100.0 (fL)    MCH 30.2  26.0 - 34.0 (pg)    MCHC 33.1  30.0 - 36.0 (g/dL)    RDW 78.4  69.6 - 29.5 (%)    Platelets 426 (*) 150 - 400 (K/uL)   CARDIAC PANEL(CRET KIN+CKTOT+MB+TROPI)     Status: Normal   Collection Time   11/22/11  9:24 AM      Component Value Range Comment   Total CK 44  7 - 232 (U/L)    CK, MB 2.0  0.3 - 4.0 (ng/mL)    Troponin I <0.30  <0.30 (ng/mL)    Relative Index RELATIVE INDEX IS INVALID  0.0 - 2.5      Dg Chest 2 View  11/21/2011  *RADIOLOGY REPORT*  Clinical Data: Shortness of breath for 8 days, fever, weakness, loss of appetite, former smoker  CHEST - 2 VIEW  Comparison: 11/17/2011 Correlation:  CT chest 11/17/2011  Findings: Normal heart size, mediastinal contours and pulmonary vascularity. Bronchitic changes. Interstitial changes in mid-to-lower lungs with bibasilar atelectasis. Patchy nodular foci are seen in both lungs corresponding to pulmonary nodules identified on the preceding CT. No pleural effusion or pneumothorax. Diffuse idiopathic skeletal hyperostosis. Minimal end plate spur formation thoracic spine.  IMPRESSION: Bronchitic changes with interstitial changes in the mid-to-lower lungs and coexistent bibasilar atelectasis. Bilateral pulmonary nodules, cannot exclude metastatic disease though these could also be seen with inflammatory processes or infection; consider tissue diagnosis.  Original Report Authenticated By: Lollie Marrow, M.D.   Dg Abd 1 View  11/21/2011  *RADIOLOGY REPORT*  Clinical Data: Distal left ureteral calculus, question persistence  ABDOMEN - 1 VIEW  Comparison: 11/18/2011 CT abdomen and pelvis  Findings: Retained barium throughout the colon from prior CT. 7 x 5 mm distal left ureteral calculus unchanged. No other definite urinary tract calcifications identified. Bulky endplate spur formation throughout the lumbar spine. Small bowel gas pattern normal.  IMPRESSION: Persistent 7 x 5 mm distal left ureteral calculus.  Original Report  Authenticated By: Lollie Marrow, M.D.   US Scrotum  11/21/2011  *RADIOLOGY REPORT*  Clinical Data:  Lung nodules, abdominal lymph node mass and possible testicular mass.  SCROTAL ULTRASOUND DOPPLER ULTRASOUND OF THE TESTICLES  Technique: Complete ultrasound examination of the testicles, epididymis, and other scrotal structures was performed.  Color and spectral Doppler ultrasound were also utilized to evaluate blood flow to the testicles.  Comparison:  CT of the abdomen and pelvis dated 11/18/2011  Findings:  Right testis:  4.5 x 2.3 x 2.4 cm.  Normal echotexture without evidence of focal mass.  Left testis:  4.0 x 2.1 x 2.7 cm.  Normal echotexture without focal mass.  Right epididymis:  Normal in size and appearance.  Left epididymis:  There is a benign appearing focal cyst of the left epididymis measuring 0.7 cm.  Hydrocele:  Small left hydrocele.  Varicocele:  No evidence of varicocele.  Pulsed Doppler interrogation of both testes demonstrates normal arterial inflow and venous outflow in both testicles.  IMPRESSION: No evidence of testicular mass.  Original Report Authenticated By: Reola Calkins, M.D.   [place] Art/ven Flow Abd Pelv Doppler  11/21/2011  *RADIOLOGY REPORT*  Clinical Data:  Lung  nodules, abdominal lymph node mass and possible testicular mass.  SCROTAL ULTRASOUND DOPPLER ULTRASOUND OF THE TESTICLES  Technique: Complete ultrasound examination of the testicles, epididymis, and other scrotal structures was performed.  Color and spectral Doppler ultrasound were also utilized to evaluate blood flow to the testicles.  Comparison:  CT of the abdomen and pelvis dated 11/18/2011  Findings:  Right testis:  4.5 x 2.3 x 2.4 cm.  Normal echotexture without evidence of focal mass.  Left testis:  4.0 x 2.1 x 2.7 cm.  Normal echotexture without focal mass.  Right epididymis:  Normal in size and appearance.  Left epididymis:  There is a benign appearing focal cyst of the left epididymis measuring 0.7 cm.   Hydrocele:  Small left hydrocele.  Varicocele:  No evidence of varicocele.  Pulsed Doppler interrogation of both testes demonstrates normal arterial inflow and venous outflow in both testicles.  IMPRESSION: No evidence of testicular mass.  Original Report Authenticated By: Reola Calkins, M.D.    ROS Blood pressure 135/73, pulse 105, temperature 99.2 F (37.3 C), temperature source Oral, resp. rate 20, height 6\' 2"  (1.88 m), weight 97.523 kg (215 lb), SpO2 89.00%. Physical Exam Awake and alert very pleasant gentleman accompanied by wife and family. Oriented to person place and time and circumstance. Wrist had minimal swelling non-erythematous no palpable effusions good range of motion. It was essentially unremarkable. But his shoulders well. No hip or groin pain with lower 30 movement. Right ankle mildly swollen but has history of ankle fracture there was intermittent pain. Right ankle is not erythematous and no palpable effusion range of motion actively and passively. Left ankle unremarkable.  Right knee not warm to touch not erythematous no palpable effusion no. Tibia swelling digital range of motion mildly diffusely tender. Left knee 2+ palpable effusion not warm to touch not erythematous diffusely tender good gentle range of motion.  Procedure after discussed with the patient's family the need for left knee arthrocentesis it was performed. Left knee was cleaned and sterilized with chlorhexidine prepped. Utilizing sterile gloves skin was anesthetized with 5 cc of 1% lidocaine with epinephrine. Utilizing 18-gauge needle arthrocentesis was performed from proximal lateral site. 20 cc of thin serous non-infected inflammatory fluid was removed without difficulty. This was sent for appropriate studies Gram stain culture anaerobic and aerobic AFB stain and fungal and mycobacterium cultures. Assessment/Plan: Very pleasant but unfortunate 71 year old male with migratory polyarthralgias pulmonary nodules  and etc. The left knee aspirate does not appear purulent by any means. Will wait and followed arthrocentesis fluid make appropriate orthopedic intervention when necessary. For now continue current treatment if a non-infectious etiology is determined than an autoimmune process should be considered in addition to metastatic disease workup. If autoimmune is considered would recommend rheumatology consult.  Atiyana Welte ANDREW 11/22/2011, 4:57 PM

## 2011-11-22 NOTE — Progress Notes (Signed)
Subjective: Patient reports that he feels a bit better today.  His wife states that he has been having significant nighttime frequency, up to 4-5 times, as well as daytime urgency.  Objective: Vital signs in last 24 hours: Temp:  [98 F (36.7 C)-100.3 F (37.9 C)] 98 F (36.7 C) (05/11 0549) Pulse Rate:  [76-116] 87  (05/11 0549) Resp:  [16-20] 18  (05/11 0549) BP: (112-177)/(47-80) 130/72 mmHg (05/11 0549) SpO2:  [91 %-94 %] 92 % (05/11 0742) Weight:  [97.523 kg (215 lb)] 97.523 kg (215 lb) (05/10 0913)  Intake/Output from previous day: 05/10 0701 - 05/11 0700 In: 900 [I.V.:900] Out: 600 [Urine:600] Intake/Output this shift:    Physical Exam:  Constitutional: Vital signs reviewed. WD WN in NAD   Lab Results:  Basename 11/22/11 0520 11/21/11 0950  HGB 11.9* 12.9*  HCT 36.0* 38.9*   BMET  Basename 11/22/11 0520 11/21/11 0950  NA 136 134*  K 3.8 3.9  CL 100 97  CO2 26 24  GLUCOSE 140* 202*  BUN 11 16  CREATININE 1.07 0.97  CALCIUM 8.7 9.3    Basename 11/21/11 1700  LABPT --  INR 1.18   No results found for this basename: LABURIN:1 in the last 72 hours Results for orders placed during the hospital encounter of 03/13/11  URINE CULTURE     Status: Normal   Collection Time   03/13/11  8:07 AM      Component Value Range Status Comment   Specimen Description URINE, CLEAN CATCH   Final    Special Requests NONE   Final    Culture  Setup Time [id number]   Final    Colony Count NO GROWTH   Final    Culture NO GROWTH   Final    Report Status 03/14/2011 FINAL   Final     Studies/Results: Dg Chest 2 View  11/21/2011  *RADIOLOGY REPORT*  Clinical Data: Shortness of breath for 8 days, fever, weakness, loss of appetite, former smoker  CHEST - 2 VIEW  Comparison: 11/17/2011 Correlation:  CT chest 11/17/2011  Findings: Normal heart size, mediastinal contours and pulmonary vascularity. Bronchitic changes. Interstitial changes in mid-to-lower lungs with bibasilar  atelectasis. Patchy nodular foci are seen in both lungs corresponding to pulmonary nodules identified on the preceding CT. No pleural effusion or pneumothorax. Diffuse idiopathic skeletal hyperostosis. Minimal end plate spur formation thoracic spine.  IMPRESSION: Bronchitic changes with interstitial changes in the mid-to-lower lungs and coexistent bibasilar atelectasis. Bilateral pulmonary nodules, cannot exclude metastatic disease though these could also be seen with inflammatory processes or infection; consider tissue diagnosis.  Original Report Authenticated By: Lollie Marrow, M.D.   Dg Abd 1 View  11/21/2011  *RADIOLOGY REPORT*  Clinical Data: Distal left ureteral calculus, question persistence  ABDOMEN - 1 VIEW  Comparison: 11/18/2011 CT abdomen and pelvis  Findings: Retained barium throughout the colon from prior CT. 7 x 5 mm distal left ureteral calculus unchanged. No other definite urinary tract calcifications identified. Bulky endplate spur formation throughout the lumbar spine. Small bowel gas pattern normal.  IMPRESSION: Persistent 7 x 5 mm distal left ureteral calculus.  Original Report Authenticated By: Lollie Marrow, M.D.   US Scrotum  11/21/2011  *RADIOLOGY REPORT*  Clinical Data:  Lung nodules, abdominal lymph node mass and possible testicular mass.  SCROTAL ULTRASOUND DOPPLER ULTRASOUND OF THE TESTICLES  Technique: Complete ultrasound examination of the testicles, epididymis, and other scrotal structures was performed.  Color and spectral Doppler ultrasound were also utilized  to evaluate blood flow to the testicles.  Comparison:  CT of the abdomen and pelvis dated 11/18/2011  Findings:  Right testis:  4.5 x 2.3 x 2.4 cm.  Normal echotexture without evidence of focal mass.  Left testis:  4.0 x 2.1 x 2.7 cm.  Normal echotexture without focal mass.  Right epididymis:  Normal in size and appearance.  Left epididymis:  There is a benign appearing focal cyst of the left epididymis measuring 0.7 cm.   Hydrocele:  Small left hydrocele.  Varicocele:  No evidence of varicocele.  Pulsed Doppler interrogation of both testes demonstrates normal arterial inflow and venous outflow in both testicles.  IMPRESSION: No evidence of testicular mass.  Original Report Authenticated By: Reola Calkins, M.D.   [place] Art/ven Flow Abd Pelv Doppler  11/21/2011  *RADIOLOGY REPORT*  Clinical Data:  Lung nodules, abdominal lymph node mass and possible testicular mass.  SCROTAL ULTRASOUND DOPPLER ULTRASOUND OF THE TESTICLES  Technique: Complete ultrasound examination of the testicles, epididymis, and other scrotal structures was performed.  Color and spectral Doppler ultrasound were also utilized to evaluate blood flow to the testicles.  Comparison:  CT of the abdomen and pelvis dated 11/18/2011  Findings:  Right testis:  4.5 x 2.3 x 2.4 cm.  Normal echotexture without evidence of focal mass.  Left testis:  4.0 x 2.1 x 2.7 cm.  Normal echotexture without focal mass.  Right epididymis:  Normal in size and appearance.  Left epididymis:  There is a benign appearing focal cyst of the left epididymis measuring 0.7 cm.  Hydrocele:  Small left hydrocele.  Varicocele:  No evidence of varicocele.  Pulsed Doppler interrogation of both testes demonstrates normal arterial inflow and venous outflow in both testicles.  IMPRESSION: No evidence of testicular mass.  Original Report Authenticated By: Reola Calkins, M.D.    Assessment/Plan:   1. Left-sided distal ureteral stone, asymptomatic, with insignificant mild left hydroureter    2. Nocturnal frequency, daytime urgency. This is probably a combination the patient's peripheral edema mobilizing at night as well as mild overactive bladder    I spoke with the patient and his family. At this point, I do not think we need to pursue stone management. If he is doing well from other problems, we can address this in the future. I do agree with tamsulosin management. If his urinary symptoms  didn't have enough down the road, he might be a candidate for anti-muscarinic therapy   LOS: 1 day   Chelsea Aus 11/22/2011, 9:03 AM

## 2011-11-22 NOTE — Progress Notes (Signed)
*  PRELIMINARY RESULTS* Echocardiogram 2D Echocardiogram has been performed.  Robert Mcgee 11/22/2011, 12:28 PM

## 2011-11-22 NOTE — Progress Notes (Addendum)
Subjective: He has swelling pain redness and erythema of his left knee, right ankle He is still complaining off left-sided pleuritic chest pain Pain and swelling in his wrist has improved  he feels somewhat better compared to yesterday Wife states that he is still not able to ambulate because of pain in his legs  Objective: Vital signs in last 24 hours: Filed Vitals:   11/21/11 2304 11/22/11 0212 11/22/11 0549 11/22/11 0742  BP: 129/68 112/69 130/72   Pulse: 116 90 87   Temp: 98.8 F (37.1 C) 98 F (36.7 C) 98 F (36.7 C)   TempSrc: Oral Oral Oral   Resp: 18 20 18    Height:      Weight:      SpO2: 93% 94% 91% 92%    Intake/Output Summary (Last 24 hours) at 11/22/11 1155 Last data filed at 11/22/11 0700  Gross per 24 hour  Intake    900 ml  Output    600 ml  Net    300 ml    Weight change:   HENT:  Head: Atraumatic.  Mouth/Throat: Oropharynx is clear and moist.  Eyes: Pupils are equal, round, and reactive to light.  Neck: Neck supple. No tracheal deviation present.  No stiffness or rigidity  Cardiovascular: Normal rate, regular rhythm, normal heart sounds and intact distal pulses.  Pulmonary/Chest: Effort normal. No accessory muscle usage. No respiratory distress.  Scattered rhonchi bases  Abdominal: Soft. Bowel sounds are normal. He exhibits no distension. There is no tenderness.  Genitourinary:  No cva tenderness  Musculoskeletal: Normal range of motion.  Mild joint swelling to bil elbows/wrists/knees. No erythema. Minimal discomfort w passive rom joints.  Neurological: He is alert and oriented to person, place, and time.  Skin: Skin is warm and dry.  Psychiatric: He has a normal mood and affect.      Lab Results: Results for orders placed during the hospital encounter of 11/21/11 (from the past 24 hour(s))  URINALYSIS, ROUTINE W REFLEX MICROSCOPIC     Status: Abnormal   Collection Time   11/21/11 12:08 PM      Component Value Range   Color, Urine AMBER  (*) YELLOW    APPearance CLEAR  CLEAR    Specific Gravity, Urine 1.023  1.005 - 1.030    pH 6.5  5.0 - 8.0    Glucose, UA NEGATIVE  NEGATIVE (mg/dL)   Hgb urine dipstick SMALL (*) NEGATIVE    Bilirubin Urine SMALL (*) NEGATIVE    Ketones, ur TRACE (*) NEGATIVE (mg/dL)   Protein, ur 30 (*) NEGATIVE (mg/dL)   Urobilinogen, UA 1.0  0.0 - 1.0 (mg/dL)   Nitrite NEGATIVE  NEGATIVE    Leukocytes, UA NEGATIVE  NEGATIVE   URINE MICROSCOPIC-ADD ON     Status: Normal   Collection Time   11/21/11 12:08 PM      Component Value Range   WBC, UA 3-6  <3 (WBC/hpf)   RBC / HPF 3-6  <3 (RBC/hpf)   Urine-Other MUCOUS PRESENT    LACTATE DEHYDROGENASE     Status: Normal   Collection Time   11/21/11  1:30 PM      Component Value Range   LDH 181  94 - 250 (U/L)  URIC ACID     Status: Abnormal   Collection Time   11/21/11  5:00 PM      Component Value Range   Uric Acid, Serum 3.4 (*) 4.0 - 7.8 (mg/dL)  PROTIME-INR     Status: Normal  Collection Time   11/21/11  5:00 PM      Component Value Range   Prothrombin Time 15.2  11.6 - 15.2 (seconds)   INR 1.18  0.00 - 1.49   CARDIAC PANEL(CRET KIN+CKTOT+MB+TROPI)     Status: Normal   Collection Time   11/21/11  5:00 PM      Component Value Range   Total CK 62  7 - 232 (U/L)   CK, MB 2.9  0.3 - 4.0 (ng/mL)   Troponin I <0.30  <0.30 (ng/mL)   Relative Index RELATIVE INDEX IS INVALID  0.0 - 2.5   RHEUMATOID FACTOR     Status: Abnormal   Collection Time   11/21/11  6:30 PM      Component Value Range   Rheumatoid Factor 18 (*) <=14 (IU/mL)  CARDIAC PANEL(CRET KIN+CKTOT+MB+TROPI)     Status: Normal   Collection Time   11/22/11 12:20 AM      Component Value Range   Total CK 41  7 - 232 (U/L)   CK, MB 1.9  0.3 - 4.0 (ng/mL)   Troponin I <0.30  <0.30 (ng/mL)   Relative Index RELATIVE INDEX IS INVALID  0.0 - 2.5   COMPREHENSIVE METABOLIC PANEL     Status: Abnormal   Collection Time   11/22/11  5:20 AM      Component Value Range   Sodium 136  135 - 145  (mEq/L)   Potassium 3.8  3.5 - 5.1 (mEq/L)   Chloride 100  96 - 112 (mEq/L)   CO2 26  19 - 32 (mEq/L)   Glucose, Bld 140 (*) 70 - 99 (mg/dL)   BUN 11  6 - 23 (mg/dL)   Creatinine, Ser 1.61  0.50 - 1.35 (mg/dL)   Calcium 8.7  8.4 - 09.6 (mg/dL)   Total Protein 6.3  6.0 - 8.3 (g/dL)   Albumin 1.9 (*) 3.5 - 5.2 (g/dL)   AST 30  0 - 37 (U/L)   ALT 38  0 - 53 (U/L)   Alkaline Phosphatase 192 (*) 39 - 117 (U/L)   Total Bilirubin 0.5  0.3 - 1.2 (mg/dL)   GFR calc non Af Amer 68 (*) >90 (mL/min)   GFR calc Af Amer 79 (*) >90 (mL/min)  CBC     Status: Abnormal   Collection Time   11/22/11  5:20 AM      Component Value Range   WBC 9.2  4.0 - 10.5 (K/uL)   RBC 3.94 (*) 4.22 - 5.81 (MIL/uL)   Hemoglobin 11.9 (*) 13.0 - 17.0 (g/dL)   HCT 04.5 (*) 40.9 - 52.0 (%)   MCV 91.4  78.0 - 100.0 (fL)   MCH 30.2  26.0 - 34.0 (pg)   MCHC 33.1  30.0 - 36.0 (g/dL)   RDW 81.1  91.4 - 78.2 (%)   Platelets 426 (*) 150 - 400 (K/uL)  CARDIAC PANEL(CRET KIN+CKTOT+MB+TROPI)     Status: Normal   Collection Time   11/22/11  9:24 AM      Component Value Range   Total CK 44  7 - 232 (U/L)   CK, MB 2.0  0.3 - 4.0 (ng/mL)   Troponin I <0.30  <0.30 (ng/mL)   Relative Index RELATIVE INDEX IS INVALID  0.0 - 2.5      Micro: Recent Results (from the past 240 hour(s))  CULTURE, BLOOD (ROUTINE X 2)     Status: Normal (Preliminary result)   Collection Time   11/21/11  9:50 AM  Component Value Range Status Comment   Specimen Description BLOOD RIGHT ANTECUBITAL   Final    Special Requests BOTTLES DRAWN AEROBIC AND ANAEROBIC   Final    Culture  Setup Time [id number]   Final    Culture     Final    Value:        BLOOD CULTURE RECEIVED NO GROWTH TO DATE CULTURE WILL BE HELD FOR 5 DAYS BEFORE ISSUING A FINAL NEGATIVE REPORT   Report Status PENDING   Incomplete   CULTURE, BLOOD (ROUTINE X 2)     Status: Normal (Preliminary result)   Collection Time   11/21/11  9:50 AM      Component Value Range Status Comment    Specimen Description BLOOD LEFT FOREARM   Final    Special Requests BOTTLES DRAWN AEROBIC AND ANAEROBIC   Final    Culture  Setup Time [id number]   Final    Culture     Final    Value:        BLOOD CULTURE RECEIVED NO GROWTH TO DATE CULTURE WILL BE HELD FOR 5 DAYS BEFORE ISSUING A FINAL NEGATIVE REPORT   Report Status PENDING   Incomplete     Studies/Results: Dg Chest 2 View  11/21/2011  *RADIOLOGY REPORT*  Clinical Data: Shortness of breath for 8 days, fever, weakness, loss of appetite, former smoker  CHEST - 2 VIEW  Comparison: 11/17/2011 Correlation:  CT chest 11/17/2011  Findings: Normal heart size, mediastinal contours and pulmonary vascularity. Bronchitic changes. Interstitial changes in mid-to-lower lungs with bibasilar atelectasis. Patchy nodular foci are seen in both lungs corresponding to pulmonary nodules identified on the preceding CT. No pleural effusion or pneumothorax. Diffuse idiopathic skeletal hyperostosis. Minimal end plate spur formation thoracic spine.  IMPRESSION: Bronchitic changes with interstitial changes in the mid-to-lower lungs and coexistent bibasilar atelectasis. Bilateral pulmonary nodules, cannot exclude metastatic disease though these could also be seen with inflammatory processes or infection; consider tissue diagnosis.  Original Report Authenticated By: Lollie Marrow, M.D.   Dg Abd 1 View  11/21/2011  *RADIOLOGY REPORT*  Clinical Data: Distal left ureteral calculus, question persistence  ABDOMEN - 1 VIEW  Comparison: 11/18/2011 CT abdomen and pelvis  Findings: Retained barium throughout the colon from prior CT. 7 x 5 mm distal left ureteral calculus unchanged. No other definite urinary tract calcifications identified. Bulky endplate spur formation throughout the lumbar spine. Small bowel gas pattern normal.  IMPRESSION: Persistent 7 x 5 mm distal left ureteral calculus.  Original Report Authenticated By: Lollie Marrow, M.D.   US Scrotum  11/21/2011   *RADIOLOGY REPORT*  Clinical Data:  Lung nodules, abdominal lymph node mass and possible testicular mass.  SCROTAL ULTRASOUND DOPPLER ULTRASOUND OF THE TESTICLES  Technique: Complete ultrasound examination of the testicles, epididymis, and other scrotal structures was performed.  Color and spectral Doppler ultrasound were also utilized to evaluate blood flow to the testicles.  Comparison:  CT of the abdomen and pelvis dated 11/18/2011  Findings:  Right testis:  4.5 x 2.3 x 2.4 cm.  Normal echotexture without evidence of focal mass.  Left testis:  4.0 x 2.1 x 2.7 cm.  Normal echotexture without focal mass.  Right epididymis:  Normal in size and appearance.  Left epididymis:  There is a benign appearing focal cyst of the left epididymis measuring 0.7 cm.  Hydrocele:  Small left hydrocele.  Varicocele:  No evidence of varicocele.  Pulsed Doppler interrogation of both testes demonstrates normal arterial inflow  and venous outflow in both testicles.  IMPRESSION: No evidence of testicular mass.  Original Report Authenticated By: Reola Calkins, M.D.   [place] Art/ven Flow Abd Pelv Doppler  11/21/2011  *RADIOLOGY REPORT*  Clinical Data:  Lung nodules, abdominal lymph node mass and possible testicular mass.  SCROTAL ULTRASOUND DOPPLER ULTRASOUND OF THE TESTICLES  Technique: Complete ultrasound examination of the testicles, epididymis, and other scrotal structures was performed.  Color and spectral Doppler ultrasound were also utilized to evaluate blood flow to the testicles.  Comparison:  CT of the abdomen and pelvis dated 11/18/2011  Findings:  Right testis:  4.5 x 2.3 x 2.4 cm.  Normal echotexture without evidence of focal mass.  Left testis:  4.0 x 2.1 x 2.7 cm.  Normal echotexture without focal mass.  Right epididymis:  Normal in size and appearance.  Left epididymis:  There is a benign appearing focal cyst of the left epididymis measuring 0.7 cm.  Hydrocele:  Small left hydrocele.  Varicocele:  No evidence of  varicocele.  Pulsed Doppler interrogation of both testes demonstrates normal arterial inflow and venous outflow in both testicles.  IMPRESSION: No evidence of testicular mass.  Original Report Authenticated By: Reola Calkins, M.D.    Medications:  Scheduled Meds:   . albuterol  2.5 mg Nebulization TID  . docusate sodium  100 mg Oral BID  . doxycycline (VIBRAMYCIN) IV  100 mg Intravenous Q12H  . enoxaparin (LOVENOX) injection  40 mg Subcutaneous Q24H  . HYDROmorphone      . ipratropium  0.5 mg Nebulization TID  . piperacillin-tazobactam (ZOSYN)  IV  3.375 g Intravenous To ER  . piperacillin-tazobactam (ZOSYN)  IV  3.375 g Intravenous Q8H  . senna  1 tablet Oral BID  . Tamsulosin HCl  0.4 mg Oral Daily  . vancomycin  1,250 mg Intravenous Q12H  . vancomycin  1,000 mg Intravenous To ER  . DISCONTD: albuterol  2 puff Inhalation QID  . DISCONTD: albuterol  2.5 mg Nebulization Q6H  . DISCONTD: enoxaparin  40 mg Subcutaneous Q24H  . DISCONTD: ipratropium  0.5 mg Nebulization Q6H   Continuous Infusions:   . sodium chloride 20 mL/hr at 11/21/11 1335   PRN Meds:.acetaminophen, acetaminophen, HYDROcodone-acetaminophen, HYDROmorphone (DILAUDID) injection, ondansetron (ZOFRAN) IV, ondansetron   Assessment: Principal Problem:  *Metastasis of unknown primary Active Problems:  Weakness generalized  Pulmonary nodule  Polyarthritis  Mouth ulcers  Night sweats  Normocytic anemia   Plan: #1 migratory polyarthralgias, unclear etiology, Lyme titer, [place] spotted fever, erchliosis , pending, ESR is 69, infectious disease consultation has been obtained, discussed with Dr. Orvan Falconer, for possible Lyme disease, possible endocarditis,  #2 generalized weakness secondary to #1 #3 pulmonary nodules septic versus metastatic versus fungal, PET/CT on Monday #4 obstructive uropathy urology does not plan to intervene at this time   LOS: 1 day   [facility] 11/22/2011, 11:55 AM

## 2011-11-22 NOTE — Consult Note (Signed)
Infectious Diseases Initial Consultation         Total days of antibiotics 6        Day 2 vancomycin        Day 2 piperacillin tazobactam         Day 2 doxycycline Date of Admission:  11/21/2011  Date of Consult:  11/22/2011  Reason for Consult: low-grade fever, pulmonary nodules and polyarthritis Referring Physician:  Dr. Richarda Overlie   Problem List:  Principal Problem:  *Metastasis of unknown primary Active Problems:  Weakness generalized  Pulmonary nodule  Polyarthritis  Mouth ulcers  Night sweats  Normocytic anemia   Recommendations: 1. Continue vancomycin and piperacillin tazobactam pending blood cultures 2. Discontinue doxycycline  3. Recommend orthopedic evaluation for possible arthrocentesis   Assessment:  Mr. Robert Mcgee has developed a severe multisystem illness with pulmonary nodules, pleurisy, polyarthritis and nonspecific skin lesions. He has one distal splinter hemorrhage that could be related to trauma but I am concerned about the possibility of subacute bacterial endocarditis has been partially treated by the levofloxacin he has been taking for the past week. It is unlikely that this is tickborne illness. Opportunistic infection with fungal pathogens such as histoplasma or Blastomyces is less likely. Malignancy with a paraneoplastic syndrome is also a concern. I would continue broad antibacterial therapy pending blood cultures. I do not feel that he needs doxycycline at this time. I would consider arthrocentesis to see if there is evidence of bacterial arthritis.   HPI: Robert Mcgee is a 71 y.o. male  who was in very good health until shortly after returning from a three-day vacation to [place] on April 21. He began to notice some pleuritic right-sided chest pain and went to see his primary care doctor. He was told to take Naprosyn which gave some mild transient relief. However, the pain continued to get worse, he had low-grade fevers up to about 100.6 and then began  having diffuse, severe joint pain and swelling and return to his primary care doctor on May 6 and was started on levofloxacin. He thinks that his chest pain got a little bit better but his joint pain was getting worse so he came to the hospital yesterday and was admitted. He has also noted some sores in his mouth and on the tip of his tongue.  A CT scan done before admission shows multiple lung nodules and inflammation around the aortic bifurcation. There is also a right ureteral stone with some hydroureter.  He is continued to have low-grade fevers and night sweats over the past week. He's had a very poor appetite and his wife believes he may have lost a few pounds in the last week.  He was taking a multivitamin and fish oil supplements prior to this illness but has been on those for over 10 years without difficulty. He did notice a small non-engorged tick on his right upper back about 5 weeks ago that he removed. That was about 2 weeks before onset of this illness. He nor his wife had noticed any rash prior to admission. His wife did notice a small red spot near his right nasolabial fold this morning.  He has not had any other recent travel. He has no recent animal or pet exposure. He does not know of any similar the sick contacts.   Review of Systems: Constitutional: positive for anorexia, fatigue, fevers, night sweats and weight loss, negative for chills Eyes: negative for irritation, redness and visual disturbance Ears, nose, mouth, throat, and face:  negative for earaches, hearing loss and sore throat Respiratory: positive for pleurisy/chest pain, negative for cough, dyspnea on exertion and sputum Cardiovascular: negative for chest pain and chest pressure/discomfort Gastrointestinal: positive for nausea, negative for abdominal pain, constipation, diarrhea and vomiting Genitourinary:positive for nocturia and urgency, negative for dysuria Musculoskeletal:positive for arthralgias and joint  swelling, negative for muscle weakness     . albuterol  2.5 mg Nebulization TID  . docusate sodium  100 mg Oral BID  . doxycycline (VIBRAMYCIN) IV  100 mg Intravenous Q12H  . enoxaparin (LOVENOX) injection  40 mg Subcutaneous Q24H  . HYDROmorphone      . ipratropium  0.5 mg Nebulization TID  . piperacillin-tazobactam (ZOSYN)  IV  3.375 g Intravenous To ER  . piperacillin-tazobactam (ZOSYN)  IV  3.375 g Intravenous Q8H  . senna  1 tablet Oral BID  . Tamsulosin HCl  0.4 mg Oral Daily  . vancomycin  1,250 mg Intravenous Q12H  . vancomycin  1,000 mg Intravenous To ER  . DISCONTD: albuterol  2 puff Inhalation QID  . DISCONTD: albuterol  2.5 mg Nebulization Q6H  . DISCONTD: enoxaparin  40 mg Subcutaneous Q24H  . DISCONTD: ipratropium  0.5 mg Nebulization Q6H    Past Medical History  Diagnosis Date  . Kidney stone   . Pneumothorax     History  Substance Use Topics  . Smoking status: Former Smoker    Quit date: 11/20/1980  . Smokeless tobacco: Never Used  . Alcohol Use: No    History reviewed. No pertinent family history. No Known Allergies  OBJECTIVE: Blood pressure 130/72, pulse 87, temperature 98 F (36.7 C), temperature source Oral, resp. rate 18, height 6\' 2"  (1.88 m), weight 97.523 kg (215 lb), SpO2 92.00%. General:  alert versant. He appears uncomfortable. Skin:  he has a small red papule near the right nasolabial fold. A solitary, mildly tender red papules over the right second through fifth PIP joints. He has a distal splinter hemorrhage of his left fourth fingernail Eyes: Normal Oral: He has a small ulcer on the left lateral tongue and on the buccal mucosa of his right upper gum line Nodes: No palpable adenopathy Lungs:  clear Cor:  regular S1 and S2 and no murmurs or rubs heard Abdomen:  soft and nontender with normal bowel sounds I do not feel any masses joints. He has mild swelling of his wrist and left knee. His left knee is warm to touch. He has severe pain  with movement of the shoulders, elbows, wrists, knees and ankles  Microbiology: Recent Results (from the past 240 hour(s))  CULTURE, BLOOD (ROUTINE X 2)     Status: Normal (Preliminary result)   Collection Time   11/21/11  9:50 AM      Component Value Range Status Comment   Specimen Description BLOOD RIGHT ANTECUBITAL   Final    Special Requests BOTTLES DRAWN AEROBIC AND ANAEROBIC   Final    Culture  Setup Time [id number]   Final    Culture     Final    Value:        BLOOD CULTURE RECEIVED NO GROWTH TO DATE CULTURE WILL BE HELD FOR 5 DAYS BEFORE ISSUING A FINAL NEGATIVE REPORT   Report Status PENDING   Incomplete   CULTURE, BLOOD (ROUTINE X 2)     Status: Normal (Preliminary result)   Collection Time   11/21/11  9:50 AM      Component Value Range Status Comment   Specimen Description  BLOOD LEFT FOREARM   Final    Special Requests BOTTLES DRAWN AEROBIC AND ANAEROBIC   Final    Culture  Setup Time [id number]   Final    Culture     Final    Value:        BLOOD CULTURE RECEIVED NO GROWTH TO DATE CULTURE WILL BE HELD FOR 5 DAYS BEFORE ISSUING A FINAL NEGATIVE REPORT   Report Status PENDING   Incomplete     Cliffton Asters, MD [facility] [phone] pager   [phone] cell 11/22/2011, 11:51 AM

## 2011-11-23 DIAGNOSIS — J159 Unspecified bacterial pneumonia: Secondary | ICD-10-CM

## 2011-11-23 DIAGNOSIS — R5381 Other malaise: Secondary | ICD-10-CM

## 2011-11-23 DIAGNOSIS — R5383 Other fatigue: Secondary | ICD-10-CM

## 2011-11-23 LAB — HEPATITIS PANEL, ACUTE
HCV Ab: NEGATIVE
Hep A IgM: NEGATIVE
Hep B C IgM: NEGATIVE
Hepatitis B Surface Ag: NEGATIVE

## 2011-11-23 LAB — URINE CULTURE
Colony Count: NO GROWTH
Culture  Setup Time: 201305110154

## 2011-11-23 MED ORDER — POLYETHYLENE GLYCOL 3350 17 G PO PACK
17.0000 g | PACK | Freq: Every day | ORAL | Status: DC
  Administered 2011-11-23 – 2011-12-10 (×11): 17 g via ORAL
  Filled 2011-11-23 (×18): qty 1

## 2011-11-23 MED ORDER — ENSURE COMPLETE PO LIQD
237.0000 mL | Freq: Two times a day (BID) | ORAL | Status: DC
  Administered 2011-11-23 – 2011-11-27 (×6): 237 mL via ORAL

## 2011-11-23 NOTE — Progress Notes (Signed)
Patient ID: Robert Mcgee, male   DOB: [birth year], 71 y.o.   MRN: [id number] Subjective:     Seen and evaluated for left knee pain Left knee somewhat better today  Patient reports pain as mild.  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

## 2011-11-23 NOTE — Progress Notes (Addendum)
Subjective: joint pain is slightly better today. He still has some pleuritic chest pain but no cough or shortness of breath   Objective: Vital signs in last 24 hours: Filed Vitals:   11/22/11 2023 11/22/11 2239 11/23/11 0431 11/23/11 0838  BP:  129/71 147/72   Pulse:  89 107   Temp:  98.2 F (36.8 C) 99.4 F (37.4 C)   TempSrc:  Oral Oral   Resp:  18 18   Height:      Weight:      SpO2: 93% 93% 91% 92%    Intake/Output Summary (Last 24 hours) at 11/23/11 1324 Last data filed at 11/23/11 0856  Gross per 24 hour  Intake    240 ml  Output      0 ml  Net    240 ml    Weight change:   General: He is having mild chills.  Skin: No change in the small red papules on his right nasolabial fold and right second through fifth PIP joints. No change in left fourth finger splinter hemorrhage.  Nodes: No palpable adenopathy  Oral: No change in the shallow ulcer on the right upper gum line the under dentures and on left lateral tongue  Lungs: Clear  Cor: Regular S1 and S2 no murmurs  Abdomen: Soft and nontender. No liver, spleen or masses palpable  joints: His left knee is less swollen and his knees wrists and elbows seem to be less painful      Lab Results: Results for orders placed during the hospital encounter of 11/21/11 (from the past 24 hour(s))  BODY FLUID CULTURE     Status: Normal (Preliminary result)   Collection Time   11/22/11  4:54 PM      Component Value Range   Specimen Description KNEE     Special Requests NONE     Gram Stain       Value: NO WBC SEEN     NO ORGANISMS SEEN   Culture PENDING     Report Status PENDING    CELL COUNT + DIFF,  W/ CRYST-SYNVL FLD     Status: Abnormal   Collection Time   11/22/11  4:57 PM      Component Value Range   Color, Synovial YELLOW  YELLOW    Appearance-Synovial HAZY (*) CLEAR    Crystals, Fluid NO CRYSTALS SEEN     WBC, Synovial 412 (*) 0 - 200 (/cu mm)   Neutrophil, Synovial 69 (*) 0 - 25 (%)   Lymphocytes-Synovial Fld  22 (*) 0 - 20 (%)   Monocyte-Macrophage-Synovial Fluid 9 (*) 50 - 90 (%)   Eosinophils-Synovial 0  0 - 1 (%)  AFB CULTURE WITH SMEAR     Status: Normal (Preliminary result)   Collection Time   11/22/11  5:34 PM      Component Value Range   Specimen Description FLUID LEFT KNEE     Special Requests NONE     ACID FAST SMEAR NO ACID FAST BACILLI SEEN     Culture       Value: CULTURE WILL BE EXAMINED FOR 6 WEEKS BEFORE ISSUING A FINAL REPORT   Report Status PENDING       Micro: Recent Results (from the past 240 hour(s))  CULTURE, BLOOD (ROUTINE X 2)     Status: Normal (Preliminary result)   Collection Time   11/21/11  9:50 AM      Component Value Range Status Comment   Specimen Description BLOOD RIGHT ANTECUBITAL  Final    Special Requests BOTTLES DRAWN AEROBIC AND ANAEROBIC   Final    Culture  Setup Time [id number]   Final    Culture     Final    Value:        BLOOD CULTURE RECEIVED NO GROWTH TO DATE CULTURE WILL BE HELD FOR 5 DAYS BEFORE ISSUING A FINAL NEGATIVE REPORT   Report Status PENDING   Incomplete   CULTURE, BLOOD (ROUTINE X 2)     Status: Normal (Preliminary result)   Collection Time   11/21/11  9:50 AM      Component Value Range Status Comment   Specimen Description BLOOD LEFT FOREARM   Final    Special Requests BOTTLES DRAWN AEROBIC AND ANAEROBIC   Final    Culture  Setup Time [id number]   Final    Culture     Final    Value:        BLOOD CULTURE RECEIVED NO GROWTH TO DATE CULTURE WILL BE HELD FOR 5 DAYS BEFORE ISSUING A FINAL NEGATIVE REPORT   Report Status PENDING   Incomplete   BODY FLUID CULTURE     Status: Normal (Preliminary result)   Collection Time   11/22/11  4:54 PM      Component Value Range Status Comment   Specimen Description KNEE   Final    Special Requests NONE   Final    Gram Stain     Final    Value: NO WBC SEEN     NO ORGANISMS SEEN   Culture PENDING   Incomplete    Report Status PENDING   Incomplete   AFB CULTURE WITH SMEAR      Status: Normal (Preliminary result)   Collection Time   11/22/11  5:34 PM      Component Value Range Status Comment   Specimen Description FLUID LEFT KNEE   Final    Special Requests NONE   Final    ACID FAST SMEAR NO ACID FAST BACILLI SEEN   Final    Culture     Final    Value: CULTURE WILL BE EXAMINED FOR 6 WEEKS BEFORE ISSUING A FINAL REPORT   Report Status PENDING   Incomplete     Studies/Results: US Scrotum  11/21/2011  *RADIOLOGY REPORT*  Clinical Data:  Lung nodules, abdominal lymph node mass and possible testicular mass.  SCROTAL ULTRASOUND DOPPLER ULTRASOUND OF THE TESTICLES  Technique: Complete ultrasound examination of the testicles, epididymis, and other scrotal structures was performed.  Color and spectral Doppler ultrasound were also utilized to evaluate blood flow to the testicles.  Comparison:  CT of the abdomen and pelvis dated 11/18/2011  Findings:  Right testis:  4.5 x 2.3 x 2.4 cm.  Normal echotexture without evidence of focal mass.  Left testis:  4.0 x 2.1 x 2.7 cm.  Normal echotexture without focal mass.  Right epididymis:  Normal in size and appearance.  Left epididymis:  There is a benign appearing focal cyst of the left epididymis measuring 0.7 cm.  Hydrocele:  Small left hydrocele.  Varicocele:  No evidence of varicocele.  Pulsed Doppler interrogation of both testes demonstrates normal arterial inflow and venous outflow in both testicles.  IMPRESSION: No evidence of testicular mass.  Original Report Authenticated By: Reola Calkins, M.D.   [place] Art/ven Flow Abd Pelv Doppler  11/21/2011  *RADIOLOGY REPORT*  Clinical Data:  Lung nodules, abdominal lymph node mass and possible testicular mass.  SCROTAL ULTRASOUND DOPPLER ULTRASOUND  OF THE TESTICLES  Technique: Complete ultrasound examination of the testicles, epididymis, and other scrotal structures was performed.  Color and spectral Doppler ultrasound were also utilized to evaluate blood flow to the testicles.  Comparison:   CT of the abdomen and pelvis dated 11/18/2011  Findings:  Right testis:  4.5 x 2.3 x 2.4 cm.  Normal echotexture without evidence of focal mass.  Left testis:  4.0 x 2.1 x 2.7 cm.  Normal echotexture without focal mass.  Right epididymis:  Normal in size and appearance.  Left epididymis:  There is a benign appearing focal cyst of the left epididymis measuring 0.7 cm.  Hydrocele:  Small left hydrocele.  Varicocele:  No evidence of varicocele.  Pulsed Doppler interrogation of both testes demonstrates normal arterial inflow and venous outflow in both testicles.  IMPRESSION: No evidence of testicular mass.  Original Report Authenticated By: Reola Calkins, M.D.   Dg Knee Left Port  11/22/2011  *RADIOLOGY REPORT*  Clinical Data: Knee swelling and pain.  PORTABLE LEFT KNEE - 1-2 VIEW  Comparison: None.  Findings: No acute bony or joint abnormality is identified.  There is some medial compartment joint space narrowing.  Small patellofemoral osteophytes are noted.  Very small joint effusion is present.  IMPRESSION:  1.  No acute finding.  2.  Mild to moderate degenerative change, most notable in the medial compartment.  Original Report Authenticated By: Bernadene Bell. Maricela Curet, M.D.   Dg Knee Right Port  11/22/2011  *RADIOLOGY REPORT*  Clinical Data: Knee pain and swelling.  PORTABLE RIGHT KNEE - 1-2 VIEW  Comparison: None.  Findings: No fracture is identified.  Osteochondral lesion along the medial aspect of the medial femoral condyle is noted.  There is some degenerative disease about the knee with near bone-on-bone joint space narrowing in the medial compartment.  Small joint effusion is noted.  IMPRESSION:  1.  No acute finding. 2.  Degenerative disease appearing worst the medial compartment where it is moderate to advanced.  Likely osteochondral lesion medial femoral condyle noted.  Original Report Authenticated By: Bernadene Bell. Maricela Curet, M.D.    Medications:  Scheduled Meds:   . albuterol  2.5 mg Nebulization  TID  . docusate sodium  100 mg Oral BID  . enoxaparin (LOVENOX) injection  40 mg Subcutaneous Q24H  . feeding supplement  237 mL Oral BID BM  . ipratropium  0.5 mg Nebulization TID  . lidocaine-EPINEPHrine  10 mL Other Once  . piperacillin-tazobactam (ZOSYN)  IV  3.375 g Intravenous Q8H  . senna  1 tablet Oral BID  . Tamsulosin HCl  0.4 mg Oral Daily  . vancomycin  1,250 mg Intravenous Q12H  . DISCONTD: doxycycline (VIBRAMYCIN) IV  100 mg Intravenous Q12H   Continuous Infusions:   . sodium chloride 20 mL/hr at 11/21/11 1335   PRN Meds:.acetaminophen, acetaminophen, diphenhydrAMINE, HYDROcodone-acetaminophen, HYDROmorphone (DILAUDID) injection, ondansetron (ZOFRAN) IV, ondansetron   Assessment: Principal Problem:  *Metastasis of unknown primary Active Problems:  Weakness generalized  Pulmonary nodule  Polyarthritis  Mouth ulcers  Night sweats  Normocytic anemia   Plan:  #1 migratory polyarthralgias, unclear etiology, Lyme titer, [place] spotted fever, erchliosis , pending, ESR is 69, infectious disease consultation has been obtained, discussed with Dr. Orvan Falconer, for possible Lyme disease, possible endocarditis, Discontinue doxycycline, left knee aspiration was done yesterday, synovial fluid analysis was negative for gout, pseudogout, septic arthritis\Cryptococcal antigen, histoplasma antigen and antibody, Blastomyces antigen, and serum ACE level   #2 generalized weakness secondary to #1  #  3 pulmonary nodules septic versus metastatic versus fungal, PET/CT on Monday , CT-guided biopsy tomorrow, Gram stain, aerobic and anaerobic cultures; AFB stain and culture; and fungal stain and cultures  #4 obstructive uropathy urology does not plan to intervene at this time   LOS: 2 days   [facility] 11/23/2011, 1:24 PM

## 2011-11-23 NOTE — Progress Notes (Signed)
Patient ID: Robert Mcgee, male   DOB: 1940-11-07, 71 y.o.   MRN: [id number]  INFECTIOUS DISEASE PROGRESS NOTE    Date of Admission:  11/21/2011   Total days of antibiotics 7        Day 3 vancomycin        Day 3 piperacillin tazobactam        Day  3 doxycycline  Subjective: His joint pain is slightly better today. He still has some pleuritic chest pain but no cough or shortness of breath. He was born and raised in [place] it with the exception of a [place] in [place]has lived in [place] his entire life.  Objective: Temp:  [98.2 F (36.8 C)-99.4 F (37.4 C)] 99.4 F (37.4 C) (05/12 0431) Pulse Rate:  [89-107] 107  (05/12 0431) Resp:  [18-20] 18  (05/12 0431) BP: (129-147)/(71-73) 147/72 mmHg (05/12 0431) SpO2:  [89 %-93 %] 92 % (05/12 0838)  General:  He is having mild chills. Skin:  No change in the small red papules on his right nasolabial fold and right second through fifth PIP joints. No change in left fourth finger splinter hemorrhage. Nodes: No palpable adenopathy Oral: No change in the shallow ulcer on the right upper gum line the under dentures and on left lateral tongue Lungs:  Clear Cor:  Regular S1 and S2 no murmurs Abdomen:  Soft and nontender. No liver, spleen or masses palpable joints: His left knee is less swollen and his knees wrists and elbows seem to be less painful  Lab Results Lab Results  Component Value Date   WBC 9.2 11/22/2011   HGB 11.9* 11/22/2011   HCT 36.0* 11/22/2011   MCV 91.4 11/22/2011   PLT 426* 11/22/2011    Lab Results  Component Value Date   CREATININE 1.07 11/22/2011   BUN 11 11/22/2011   NA 136 11/22/2011   K 3.8 11/22/2011   CL 100 11/22/2011   CO2 26 11/22/2011    Lab Results  Component Value Date   ALT 38 11/22/2011   AST 30 11/22/2011   ALKPHOS 192* 11/22/2011   BILITOT 0.5 11/22/2011       Microbiology: Recent Results (from the past 240 hour(s))  CULTURE, BLOOD (ROUTINE X 2)     Status: Normal (Preliminary result)     Collection Time   11/21/11  9:50 AM      Component Value Range Status Comment   Specimen Description BLOOD RIGHT ANTECUBITAL   Final    Special Requests BOTTLES DRAWN AEROBIC AND ANAEROBIC   Final    Culture  Setup Time [id number]   Final    Culture     Final    Value:        BLOOD CULTURE RECEIVED NO GROWTH TO DATE CULTURE WILL BE HELD FOR 5 DAYS BEFORE ISSUING A FINAL NEGATIVE REPORT   Report Status PENDING   Incomplete   CULTURE, BLOOD (ROUTINE X 2)     Status: Normal (Preliminary result)   Collection Time   11/21/11  9:50 AM      Component Value Range Status Comment   Specimen Description BLOOD LEFT FOREARM   Final    Special Requests BOTTLES DRAWN AEROBIC AND ANAEROBIC   Final    Culture  Setup Time [id number]   Final    Culture     Final    Value:        BLOOD CULTURE RECEIVED NO GROWTH TO DATE CULTURE WILL  BE HELD FOR 5 DAYS BEFORE ISSUING A FINAL NEGATIVE REPORT   Report Status PENDING   Incomplete   BODY FLUID CULTURE     Status: Normal (Preliminary result)   Collection Time   11/22/11  4:54 PM      Component Value Range Status Comment   Specimen Description KNEE   Final    Special Requests NONE   Final    Gram Stain     Final    Value: NO WBC SEEN     NO ORGANISMS SEEN   Culture PENDING   Incomplete    Report Status PENDING   Incomplete     Studies/Results: Dg Abd 1 View  11/21/2011  *RADIOLOGY REPORT*  Clinical Data: Distal left ureteral calculus, question persistence  ABDOMEN - 1 VIEW  Comparison: 11/18/2011 CT abdomen and pelvis  Findings: Retained barium throughout the colon from prior CT. 7 x 5 mm distal left ureteral calculus unchanged. No other definite urinary tract calcifications identified. Bulky endplate spur formation throughout the lumbar spine. Small bowel gas pattern normal.  IMPRESSION: Persistent 7 x 5 mm distal left ureteral calculus.  Original Report Authenticated By: Lollie Marrow, M.D.   US Scrotum  11/21/2011  *RADIOLOGY REPORT*   Clinical Data:  Lung nodules, abdominal lymph node mass and possible testicular mass.  SCROTAL ULTRASOUND DOPPLER ULTRASOUND OF THE TESTICLES  Technique: Complete ultrasound examination of the testicles, epididymis, and other scrotal structures was performed.  Color and spectral Doppler ultrasound were also utilized to evaluate blood flow to the testicles.  Comparison:  CT of the abdomen and pelvis dated 11/18/2011  Findings:  Right testis:  4.5 x 2.3 x 2.4 cm.  Normal echotexture without evidence of focal mass.  Left testis:  4.0 x 2.1 x 2.7 cm.  Normal echotexture without focal mass.  Right epididymis:  Normal in size and appearance.  Left epididymis:  There is a benign appearing focal cyst of the left epididymis measuring 0.7 cm.  Hydrocele:  Small left hydrocele.  Varicocele:  No evidence of varicocele.  Pulsed Doppler interrogation of both testes demonstrates normal arterial inflow and venous outflow in both testicles.  IMPRESSION: No evidence of testicular mass.  Original Report Authenticated By: Reola Calkins, M.D.   [place] Art/ven Flow Abd Pelv Doppler  11/21/2011  *RADIOLOGY REPORT*  Clinical Data:  Lung nodules, abdominal lymph node mass and possible testicular mass.  SCROTAL ULTRASOUND DOPPLER ULTRASOUND OF THE TESTICLES  Technique: Complete ultrasound examination of the testicles, epididymis, and other scrotal structures was performed.  Color and spectral Doppler ultrasound were also utilized to evaluate blood flow to the testicles.  Comparison:  CT of the abdomen and pelvis dated 11/18/2011  Findings:  Right testis:  4.5 x 2.3 x 2.4 cm.  Normal echotexture without evidence of focal mass.  Left testis:  4.0 x 2.1 x 2.7 cm.  Normal echotexture without focal mass.  Right epididymis:  Normal in size and appearance.  Left epididymis:  There is a benign appearing focal cyst of the left epididymis measuring 0.7 cm.  Hydrocele:  Small left hydrocele.  Varicocele:  No evidence of varicocele.  Pulsed Doppler  interrogation of both testes demonstrates normal arterial inflow and venous outflow in both testicles.  IMPRESSION: No evidence of testicular mass.  Original Report Authenticated By: Reola Calkins, M.D.   Dg Knee Left Port  11/22/2011  *RADIOLOGY REPORT*  Clinical Data: Knee swelling and pain.  PORTABLE LEFT KNEE - 1-2 VIEW  Comparison:  None.  Findings: No acute bony or joint abnormality is identified.  There is some medial compartment joint space narrowing.  Small patellofemoral osteophytes are noted.  Very small joint effusion is present.  IMPRESSION:  1.  No acute finding.  2.  Mild to moderate degenerative change, most notable in the medial compartment.  Original Report Authenticated By: Bernadene Bell. Maricela Curet, M.D.   Dg Knee Right Port  11/22/2011  *RADIOLOGY REPORT*  Clinical Data: Knee pain and swelling.  PORTABLE RIGHT KNEE - 1-2 VIEW  Comparison: None.  Findings: No fracture is identified.  Osteochondral lesion along the medial aspect of the medial femoral condyle is noted.  There is some degenerative disease about the knee with near bone-on-bone joint space narrowing in the medial compartment.  Small joint effusion is noted.  IMPRESSION:  1.  No acute finding. 2.  Degenerative disease appearing worst the medial compartment where it is moderate to advanced.  Likely osteochondral lesion medial femoral condyle noted.  Original Report Authenticated By: Bernadene Bell. Maricela Curet, M.D.     Assessment:  This is no real fluid analyses suggests more of a reactive arthritis plan acute bacterial infection. There is still no clear explanation for his bilateral pulmonary nodules and polyarthritis. There is a broad differential diagnoses list including bacterial, viral and fungal infections, malignancy, sarcoid and other autoimmune type disorders.  Plan: 1. Continue vancomycin and piperacillin tazobactam 2. Discontinue doxycycline 3. Await final blood cultures 4. Cryptococcal antigen, histoplasma antigen and  antibody, Blastomyces antigen, and serum ACE level 5. Agree with biopsy of lung nodule with specimens process for routine histology; Gram stain, aerobic and anaerobic cultures; AFB stain and culture; and fungal stain and cultures   Cliffton Asters, MD [facility] [phone] pager   [phone] cell 11/23/2011, 11:42 AM

## 2011-11-24 ENCOUNTER — Encounter (HOSPITAL_COMMUNITY): Payer: Self-pay | Admitting: Radiology

## 2011-11-24 ENCOUNTER — Inpatient Hospital Stay (HOSPITAL_COMMUNITY): Payer: Medicare Other

## 2011-11-24 ENCOUNTER — Other Ambulatory Visit: Payer: Self-pay | Admitting: Radiology

## 2011-11-24 ENCOUNTER — Other Ambulatory Visit (HOSPITAL_COMMUNITY): Payer: Medicare Other

## 2011-11-24 DIAGNOSIS — J159 Unspecified bacterial pneumonia: Secondary | ICD-10-CM

## 2011-11-24 DIAGNOSIS — M130 Polyarthritis, unspecified: Secondary | ICD-10-CM

## 2011-11-24 DIAGNOSIS — R918 Other nonspecific abnormal finding of lung field: Secondary | ICD-10-CM

## 2011-11-24 DIAGNOSIS — J9600 Acute respiratory failure, unspecified whether with hypoxia or hypercapnia: Secondary | ICD-10-CM | POA: Diagnosis not present

## 2011-11-24 DIAGNOSIS — K1370 Unspecified lesions of oral mucosa: Secondary | ICD-10-CM

## 2011-11-24 DIAGNOSIS — R5381 Other malaise: Secondary | ICD-10-CM

## 2011-11-24 DIAGNOSIS — R5383 Other fatigue: Secondary | ICD-10-CM

## 2011-11-24 DIAGNOSIS — R61 Generalized hyperhidrosis: Secondary | ICD-10-CM

## 2011-11-24 LAB — DIFFERENTIAL
Basophils Absolute: 0 10*3/uL (ref 0.0–0.1)
Basophils Relative: 0 % (ref 0–1)
Monocytes Absolute: 0.6 10*3/uL (ref 0.1–1.0)
Neutro Abs: 9.6 10*3/uL — ABNORMAL HIGH (ref 1.7–7.7)

## 2011-11-24 LAB — CBC
HCT: 34 % — ABNORMAL LOW (ref 39.0–52.0)
MCHC: 32.6 g/dL (ref 30.0–36.0)
RDW: 15.1 % (ref 11.5–15.5)

## 2011-11-24 LAB — BLOOD GAS, ARTERIAL
Acid-Base Excess: 6.5 mmol/L — ABNORMAL HIGH (ref 0.0–2.0)
Drawn by: 28701
FIO2: 0.4 %
FIO2: 0.4 %
Patient temperature: 98.6
TCO2: 24.1 mmol/L (ref 0–100)
pCO2 arterial: 43.9 mmHg (ref 35.0–45.0)
pH, Arterial: 7.464 — ABNORMAL HIGH (ref 7.350–7.450)

## 2011-11-24 LAB — GLUCOSE, CAPILLARY
Glucose-Capillary: 151 mg/dL — ABNORMAL HIGH (ref 70–99)
Glucose-Capillary: 122 mg/dL — ABNORMAL HIGH (ref 70–99)

## 2011-11-24 LAB — CRYPTOCOCCAL ANTIGEN: Crypto Ag: NEGATIVE

## 2011-11-24 LAB — MRSA PCR SCREENING: MRSA by PCR: NEGATIVE

## 2011-11-24 MED ORDER — FLUDEOXYGLUCOSE F - 18 (FDG) INJECTION
15.8000 | Freq: Once | INTRAVENOUS | Status: AC | PRN
  Administered 2011-11-24: 15.8 via INTRAVENOUS

## 2011-11-24 MED ORDER — METHYLPREDNISOLONE SODIUM SUCC 125 MG IJ SOLR
60.0000 mg | Freq: Four times a day (QID) | INTRAMUSCULAR | Status: DC
  Administered 2011-11-24 – 2011-11-29 (×18): 60 mg via INTRAVENOUS
  Filled 2011-11-24 (×6): qty 0.96
  Filled 2011-11-24: qty 2
  Filled 2011-11-24 (×7): qty 0.96
  Filled 2011-11-24: qty 2
  Filled 2011-11-24 (×10): qty 0.96

## 2011-11-24 MED ORDER — ALBUTEROL SULFATE (5 MG/ML) 0.5% IN NEBU: 2.5000 mg | INHALATION_SOLUTION | RESPIRATORY_TRACT | Status: DC | PRN

## 2011-11-24 MED ORDER — FUROSEMIDE 10 MG/ML IJ SOLN
40.0000 mg | Freq: Once | INTRAMUSCULAR | Status: AC
  Administered 2011-11-24: 40 mg via INTRAVENOUS
  Filled 2011-11-24: qty 4

## 2011-11-24 MED ORDER — IPRATROPIUM BROMIDE 0.02 % IN SOLN: 0.5000 mg | RESPIRATORY_TRACT | Status: DC | PRN

## 2011-11-24 MED ORDER — INSULIN ASPART 100 UNIT/ML ~~LOC~~ SOLN
0.0000 [IU] | SUBCUTANEOUS | Status: DC
  Administered 2011-11-24: 3 [IU] via SUBCUTANEOUS
  Administered 2011-11-24: 4 [IU] via SUBCUTANEOUS
  Administered 2011-11-25: 11 [IU] via SUBCUTANEOUS
  Administered 2011-11-25 (×3): 4 [IU] via SUBCUTANEOUS
  Administered 2011-11-25: 11 [IU] via SUBCUTANEOUS
  Administered 2011-11-25 – 2011-11-26 (×2): 4 [IU] via SUBCUTANEOUS
  Administered 2011-11-26: 7 [IU] via SUBCUTANEOUS
  Administered 2011-11-26: 4 [IU] via SUBCUTANEOUS
  Administered 2011-11-26: 7 [IU] via SUBCUTANEOUS
  Administered 2011-11-26 (×2): 4 [IU] via SUBCUTANEOUS
  Administered 2011-11-27: 7 [IU] via SUBCUTANEOUS
  Administered 2011-11-27: 11 [IU] via SUBCUTANEOUS
  Administered 2011-11-27 (×4): 4 [IU] via SUBCUTANEOUS
  Administered 2011-11-28: 14:00:00 via SUBCUTANEOUS
  Administered 2011-11-28 (×2): 4 [IU] via SUBCUTANEOUS

## 2011-11-24 MED ORDER — CHLORHEXIDINE GLUCONATE 0.12 % MT SOLN
15.0000 mL | Freq: Two times a day (BID) | OROMUCOSAL | Status: DC
  Administered 2011-11-24 – 2011-11-30 (×12): 15 mL via OROMUCOSAL
  Filled 2011-11-24 (×15): qty 15

## 2011-11-24 MED ORDER — SODIUM CHLORIDE 0.9 % IV SOLN
INTRAVENOUS | Status: DC
  Administered 2011-11-24 – 2011-11-25 (×2): via INTRAVENOUS

## 2011-11-24 MED ORDER — FENTANYL CITRATE 0.05 MG/ML IJ SOLN
25.0000 ug | INTRAMUSCULAR | Status: DC | PRN
  Administered 2011-11-26 (×3): 25 ug via INTRAVENOUS
  Filled 2011-11-24 (×3): qty 2

## 2011-11-24 MED ORDER — BIOTENE DRY MOUTH MT LIQD
15.0000 mL | Freq: Two times a day (BID) | OROMUCOSAL | Status: DC
  Administered 2011-11-24 – 2011-11-30 (×10): 15 mL via OROMUCOSAL

## 2011-11-24 MED ORDER — PANTOPRAZOLE SODIUM 40 MG IV SOLR
40.0000 mg | INTRAVENOUS | Status: DC
  Administered 2011-11-24 – 2011-11-26 (×3): 40 mg via INTRAVENOUS
  Filled 2011-11-24 (×5): qty 40

## 2011-11-24 MED ORDER — VANCOMYCIN HCL IN DEXTROSE 1-5 GM/200ML-% IV SOLN
1000.0000 mg | Freq: Three times a day (TID) | INTRAVENOUS | Status: DC
  Administered 2011-11-24 – 2011-11-29 (×14): 1000 mg via INTRAVENOUS
  Filled 2011-11-24 (×21): qty 200

## 2011-11-24 MED ORDER — ALBUTEROL SULFATE (5 MG/ML) 0.5% IN NEBU
2.5000 mg | INHALATION_SOLUTION | Freq: Once | RESPIRATORY_TRACT | Status: AC
  Administered 2011-11-24: 2.5 mg via RESPIRATORY_TRACT
  Filled 2011-11-24: qty 0.5

## 2011-11-24 NOTE — Progress Notes (Signed)
Report  Called to [facility]. Pt transferred to 1247.

## 2011-11-24 NOTE — Progress Notes (Addendum)
ANTIBIOTIC CONSULT NOTE - Follow Up  Pharmacy Consult for Vancomycin Indication: r/o PNA  No Known Allergies  Patient Measurements: Height: 6\' 2"  (188 cm) Weight: 215 lb (97.523 kg) IBW/kg (Calculated) : 82.2   Vital Signs: Temp: 98.3 F (36.8 C) (05/13 0545) Temp src: Axillary (05/13 0545) BP: 160/80 mmHg (05/13 0545) Pulse Rate: 116  (05/13 0545)  Labs:  Basename 11/24/11 0805 11/22/11 0520  WBC 11.4* 9.2  HGB 11.1* 11.9*  PLT 492* 426*  LABCREA -- --  CREATININE -- 1.07   Estimated Creatinine Clearance: 73.6 ml/min (by C-G formula based on Cr of 1.07). No results found for this basename: VANCOTROUGH:2,VANCOPEAK:2,VANCORANDOM:2,GENTTROUGH:2,GENTPEAK:2,GENTRANDOM:2,TOBRATROUGH:2,TOBRAPEAK:2,TOBRARND:2,AMIKACINPEAK:2,AMIKACINTROU:2,AMIKACIN:2, in the last 72 hours   Microbiology: 5/10 blood: NGTD 5/10 urine: NGF 5/11 knee aspirate: NGTD, no AFB seen  Medical History: Past Medical History  Diagnosis Date  . Kidney stone   . Pneumothorax   . Lung nodules     Medications:  Anti-infectives     Start     Dose/Rate Route Frequency Ordered Stop   11/21/11 2300   vancomycin (VANCOCIN) 1,250 mg in sodium chloride 0.9 % 250 mL IVPB        1,250 mg 166.7 mL/hr over 90 Minutes Intravenous Every 12 hours 11/21/11 1412     11/21/11 2200  piperacillin-tazobactam (ZOSYN) IVPB 3.375 g       3.375 g 12.5 mL/hr over 240 Minutes Intravenous 3 times per day 11/21/11 1349     11/21/11 1600   doxycycline (VIBRAMYCIN) 100 mg in dextrose 5 % 250 mL IVPB  Status:  Discontinued        100 mg 125 mL/hr over 120 Minutes Intravenous Every 12 hours 11/21/11 1519 11/23/11 1152   11/21/11 1330  piperacillin-tazobactam (ZOSYN) IVPB 3.375 g       3.375 g 100 mL/hr over 30 Minutes Intravenous To Emergency Dept 11/21/11 1248 11/21/11 1405   11/21/11 1330   vancomycin (VANCOCIN) IVPB 1000 mg/200 mL premix        1,000 mg 200 mL/hr over 60 Minutes Intravenous To Emergency Dept 11/21/11  1248 11/21/11 1502         Assessment:  71 yo male with 3 wk hx of joint pain, worsening this past week.  Levaquin started as an outpatient 5/6 for respiratory symptoms x 10 days.  Recent CT noted lung nodules, biopsy planned.  PET scan completed 5/13.  Day #4 Vancomycin/Zosyn  Renal function remains stable  Vancomycin trough level = 9.7 (subtherapeutic)  Goal of Therapy:  Vancomycin trough level 15-20 mcg/ml  Plan:   Increase Vancomycin to 1g IV q8h  Continue Zosyn 3.375g IV Q8H infused over 4hrs.  Follow up renal function, labs, and cultures as available.  Recheck vanc trough at new steady state.   Lynann Beaver PharmD, BCPS Pager [phone] 11/24/2011 12:05 PM

## 2011-11-24 NOTE — Progress Notes (Signed)
eLink Physician-Brief Progress Note Patient Name: Kishawn Pickar DOB: 1941-03-19 MRN: [id number]  Date of Service  11/24/2011   HPI/Events of Note  Variable  0 Points 1 Point 2 Points Total  Heart rate per minute  <90 beats 90-109 beats >110 beats 1  Respiratory  Rate per minute < 18 breaths 19-30 breaths  >30 breaths 1  Restlessness; nonpurposeful movements None  occas slight movement Frequent movement 0  Paradoxical breathing pattern: None  Present 2  Accessory muscle use: rise in clavicle during inspiration None Slight rise Pronounced rise 1  Grunting at end-expiration: guttural sound None  Present 0  Nasal flaring: involuntary movement of nares None  Present 0  Look of fear None  Eyes wide 0  Overall total out of 16    5    Respiratory Distress Observation Scale Journal of Palliative Medicine Vol. 13, Number 3, 2010 Campbell et al.     eICU Interventions  Check cxr and abg - ordered 7:48 PM    Intervention Category Major Interventions: Respiratory failure - evaluation and management  Grisell Bissette 11/24/2011, 7:46 PM

## 2011-11-24 NOTE — Progress Notes (Signed)
Pt c/o SOB. On ascultation of lungs, pt with bilateral wheezes. O2 sats 87% on 3L ,oxygen increased to 5L, O2 sat 89% 5l [place].  Pt place on  50% VM, O2 sat incresed to 92% on 50% VM. MD notified. New orders placed in Epic. Will continue to monitor.

## 2011-11-24 NOTE — Progress Notes (Signed)
Patient ID: Robert Mcgee, male   DOB: 06-16-1941, 71 y.o.   MRN: [id number] Pt scheduled for PET scan today. Will plan to review findings and make recommendations regarding biopsy afterwards. Biopsy will then be tent planned for 5/14.

## 2011-11-24 NOTE — Progress Notes (Signed)
   CARE MANAGEMENT NOTE 11/24/2011  Patient:  Robert Mcgee, Robert Mcgee   Account Number:  [id number]  Date Initiated:  11/24/2011  Documentation initiated by:  [name]  Subjective/Objective Assessment:   ADMITTED WITH GENERALIZED WEAKNESS     Action/Plan:   LIVES AT HOME WITH SPOUSE; INSEPENDENT PRIOR TO ADMISSION   Anticipated DC Date:  12/01/2011   Anticipated DC Plan:  HOME W HOME HEALTH SERVICES      Status of service:  In process, will continue to follow Medicare Important Message given?   (If response is "NO", the following Medicare IM given date fields will be blank)  Per UR Regulation:  Reviewed for med. necessity/level of care/duration of stay  Comments:  11/24/2011- B Ahava Kissoon RN, BSN, MHA

## 2011-11-24 NOTE — Progress Notes (Signed)
INFECTIOUS DISEASE PROGRESS NOTE  ID: Robert Mcgee is a 71 y.o. male with pulmonary nodules and polyarthritis  Subjective: Reports breathing is somewhat stable, not worsened, continues to be on ventimask. Reports still having nightsweats overnight but no overt fevers documented. Remote hx of smoking, quit in the 1980s.   24hr events: underwent pet scan and scheduled for lung nodule biopsy tomorrow am.  Abtx:  vanco piptazo  Medications:     . albuterol  2.5 mg Nebulization TID  . albuterol  2.5 mg Nebulization Once  . docusate sodium  100 mg Oral BID  . enoxaparin (LOVENOX) injection  40 mg Subcutaneous Q24H  . feeding supplement  237 mL Oral BID BM  . furosemide  40 mg Intravenous Once  . ipratropium  0.5 mg Nebulization TID  . piperacillin-tazobactam (ZOSYN)  IV  3.375 g Intravenous Q8H  . polyethylene glycol  17 g Oral Daily  . senna  1 tablet Oral BID  . Tamsulosin HCl  0.4 mg Oral Daily  . vancomycin  1,250 mg Intravenous Q12H    Objective: Vital signs in last 24 hours: Temp:  [98.3 F (36.8 C)-99.3 F (37.4 C)] 98.3 F (36.8 C) (05/13 0545) Pulse Rate:  [110-116] 116  (05/13 0545) Resp:  [18-20] 20  (05/13 0545) BP: (126-175)/(61-81) 160/80 mmHg (05/13 0545) SpO2:  [87 %-95 %] 92 % (05/13 1321) FiO2 (%):  [35 %-50 %] 50 % (05/13 1321)  Gen= a x o by 3 HEENT= dry oral mucosa, [place]/AT, PERRLA, EOMI Neck= supple Pulm= distant breath sounds, no rhonchi appreciated  Lab Results  Basename 11/24/11 0805 11/22/11 0520  WBC 11.4* 9.2  HGB 11.1* 11.9*  HCT 34.0* 36.0*  NA -- 136  K -- 3.8  CL -- 100  CO2 -- 26  BUN -- 11  CREATININE -- 1.07  GLU -- --   Liver Panel  Basename 11/22/11 0520  PROT 6.3  ALBUMIN 1.9*  AST 30  ALT 38  ALKPHOS 192*  BILITOT 0.5  BILIDIR --  IBILI --    Microbiology: Recent Results (from the past 240 hour(s))  CULTURE, BLOOD (ROUTINE X 2)     Status: Normal (Preliminary result)   Collection Time   11/21/11  9:50 AM   Component Value Range Status Comment   Specimen Description BLOOD RIGHT ANTECUBITAL   Final    Special Requests BOTTLES DRAWN AEROBIC AND ANAEROBIC   Final    Culture  Setup Time [id number]   Final    Culture     Final    Value:        BLOOD CULTURE RECEIVED NO GROWTH TO DATE CULTURE WILL BE HELD FOR 5 DAYS BEFORE ISSUING A FINAL NEGATIVE REPORT   Report Status PENDING   Incomplete   CULTURE, BLOOD (ROUTINE X 2)     Status: Normal (Preliminary result)   Collection Time   11/21/11  9:50 AM      Component Value Range Status Comment   Specimen Description BLOOD LEFT FOREARM   Final    Special Requests BOTTLES DRAWN AEROBIC AND ANAEROBIC   Final    Culture  Setup Time [id number]   Final    Culture     Final    Value:        BLOOD CULTURE RECEIVED NO GROWTH TO DATE CULTURE WILL BE HELD FOR 5 DAYS BEFORE ISSUING A FINAL NEGATIVE REPORT   Report Status PENDING   Incomplete   URINE CULTURE  Status: Normal   Collection Time   11/21/11 12:44 PM      Component Value Range Status Comment   Specimen Description URINE, CLEAN CATCH   Final    Special Requests NONE   Final    Culture  Setup Time [id number]   Final    Colony Count NO GROWTH   Final    Culture NO GROWTH   Final    Report Status 11/23/2011 FINAL   Final   BODY FLUID CULTURE     Status: Normal (Preliminary result)   Collection Time   11/22/11  4:54 PM      Component Value Range Status Comment   Specimen Description KNEE   Final    Special Requests NONE   Final    Gram Stain     Final    Value: NO WBC SEEN     NO ORGANISMS SEEN   Culture NO GROWTH 1 DAY   Final    Report Status PENDING   Incomplete   AFB CULTURE WITH SMEAR     Status: Normal (Preliminary result)   Collection Time   11/22/11  5:34 PM      Component Value Range Status Comment   Specimen Description FLUID LEFT KNEE   Final    Special Requests NONE   Final    ACID FAST SMEAR NO ACID FAST BACILLI SEEN   Final    Culture     Final    Value: CULTURE  WILL BE EXAMINED FOR 6 WEEKS BEFORE ISSUING A FINAL REPORT   Report Status PENDING   Incomplete     Studies/Results: 11/24/2011 PET SCAN: IMPRESSION:  Multiple bilateral pulmonary nodules, max SUV 8.6, worrisome for  primary or metastatic malignancy. Correlate with scheduled biopsy.  Nodular opacities at the bilateral lung bases, max SUV 13.4,  worrisome for malignancy although superimposed infection is not  excluded.  No abnormal soft tissues surrounding the lower abdominal aorta and  left common iliac artery, max SUV 6.2, worrisome for malignancy  such as lymphoma or nodal metastases.  Hypermetabolic focus in the left prostate gland, max SUV 6.1,  suspicious for primary prostatic adenocarcinoma. Of note, this is  considered unrelated to the above findings.    Assessment/Plan: - cultures negative to date, currently on empiric regimen of vanco and piptazo. Awaiting micro and path report of lung tissue to determine if this is related to malignancy.  - continue on current antibiotic regimen  D[facility] Lamp[facility] Infect[facility] 11/24/2011, 2:39 PM

## 2011-11-24 NOTE — Progress Notes (Signed)
Subjective: Still having low-grade fever [place]  Objective: Vital signs in last 24 hours: Filed Vitals:   11/24/11 0300 11/24/11 0545 11/24/11 0808 11/24/11 1321  BP:  160/80    Pulse:  116    Temp:  98.3 F (36.8 C)    TempSrc:  Axillary    Resp:  20    Height:      Weight:      SpO2: 93% 90% 88% 92%    Intake/Output Summary (Last 24 hours) at 11/24/11 1409 Last data filed at 11/24/11 1340  Gross per 24 hour  Intake    240 ml  Output   1160 ml  Net   -920 ml    Weight change:   General: He is having mild chills.  Skin: No change in the small red papules on his right nasolabial fold and right second through fifth PIP joints. No change in left fourth finger splinter hemorrhage.  Nodes: No palpable adenopathy  Oral: No change in the shallow ulcer on the right upper gum line the under dentures and on left lateral tongue  Lungs: Clear  Cor: Regular S1 and S2 no murmurs  Abdomen: Soft and nontender. No liver, spleen or masses palpable  joints: His left knee is less swollen and his knees wrists and elbows seem to be less painful       Lab Results: Results for orders placed during the hospital encounter of 11/21/11 (from the past 24 hour(s))  CRYPTOCOCCAL ANTIGEN     Status: Normal   Collection Time   11/24/11  8:05 AM      Component Value Range   Crypto Ag NEGATIVE  NEGATIVE    Cryptococcal Ag Titer NOT INDICATED  NOT INDICATED   CBC     Status: Abnormal   Collection Time   11/24/11  8:05 AM      Component Value Range   WBC 11.4 (*) 4.0 - 10.5 (K/uL)   RBC 3.76 (*) 4.22 - 5.81 (MIL/uL)   Hemoglobin 11.1 (*) 13.0 - 17.0 (g/dL)   HCT 40.9 (*) 81.1 - 52.0 (%)   MCV 90.4  78.0 - 100.0 (fL)   MCH 29.5  26.0 - 34.0 (pg)   MCHC 32.6  30.0 - 36.0 (g/dL)   RDW 91.4  78.2 - 95.6 (%)   Platelets 492 (*) 150 - 400 (K/uL)  DIFFERENTIAL     Status: Abnormal   Collection Time   11/24/11  8:05 AM      Component Value Range   Neutrophils Relative 84 (*) 43 - 77 (%)   Neutro  Abs 9.6 (*) 1.7 - 7.7 (K/uL)   Lymphocytes Relative 9 (*) 12 - 46 (%)   Lymphs Abs 1.1  0.7 - 4.0 (K/uL)   Monocytes Relative 5  3 - 12 (%)   Monocytes Absolute 0.6  0.1 - 1.0 (K/uL)   Eosinophils Relative 1  0 - 5 (%)   Eosinophils Absolute 0.1  0.0 - 0.7 (K/uL)   Basophils Relative 0  0 - 1 (%)   Basophils Absolute 0.0  0.0 - 0.1 (K/uL)  GLUCOSE, CAPILLARY     Status: Abnormal   Collection Time   11/24/11  9:26 AM      Component Value Range   Glucose-Capillary 128 (*) 70 - 99 (mg/dL)  BLOOD GAS, ARTERIAL     Status: Abnormal   Collection Time   11/24/11  9:30 AM      Component Value Range   FIO2 0.40  Delivery systems VENTURI MASK     pH, Arterial 7.464 (*) 7.350 - 7.450    pCO2 arterial 39.9  35.0 - 45.0 (mmHg)   pO2, Arterial 58.0 (*) 80.0 - 100.0 (mmHg)   Bicarbonate 28.3 (*) 20.0 - 24.0 (mEq/L)   TCO2 24.1  0 - 100 (mmol/L)   Acid-Base Excess 4.7 (*) 0.0 - 2.0 (mmol/L)   O2 Saturation 92.4     Patient temperature 98.6     Collection site RIGHT RADIAL     Drawn by [phone]     Sample type ARTERIAL DRAW     Allens test (pass/fail) PASS  PASS     Micro: Recent Results (from the past 240 hour(s))  CULTURE, BLOOD (ROUTINE X 2)     Status: Normal (Preliminary result)   Collection Time   11/21/11  9:50 AM      Component Value Range Status Comment   Specimen Description BLOOD RIGHT ANTECUBITAL   Final    Special Requests BOTTLES DRAWN AEROBIC AND ANAEROBIC   Final    Culture  Setup Time [id number]   Final    Culture     Final    Value:        BLOOD CULTURE RECEIVED NO GROWTH TO DATE CULTURE WILL BE HELD FOR 5 DAYS BEFORE ISSUING A FINAL NEGATIVE REPORT   Report Status PENDING   Incomplete   CULTURE, BLOOD (ROUTINE X 2)     Status: Normal (Preliminary result)   Collection Time   11/21/11  9:50 AM      Component Value Range Status Comment   Specimen Description BLOOD LEFT FOREARM   Final    Special Requests BOTTLES DRAWN AEROBIC AND ANAEROBIC   Final    Culture   Setup Time [id number]   Final    Culture     Final    Value:        BLOOD CULTURE RECEIVED NO GROWTH TO DATE CULTURE WILL BE HELD FOR 5 DAYS BEFORE ISSUING A FINAL NEGATIVE REPORT   Report Status PENDING   Incomplete   URINE CULTURE     Status: Normal   Collection Time   11/21/11 12:44 PM      Component Value Range Status Comment   Specimen Description URINE, CLEAN CATCH   Final    Special Requests NONE   Final    Culture  Setup Time [id number]   Final    Colony Count NO GROWTH   Final    Culture NO GROWTH   Final    Report Status 11/23/2011 FINAL   Final   BODY FLUID CULTURE     Status: Normal (Preliminary result)   Collection Time   11/22/11  4:54 PM      Component Value Range Status Comment   Specimen Description KNEE   Final    Special Requests NONE   Final    Gram Stain     Final    Value: NO WBC SEEN     NO ORGANISMS SEEN   Culture NO GROWTH 1 DAY   Final    Report Status PENDING   Incomplete   AFB CULTURE WITH SMEAR     Status: Normal (Preliminary result)   Collection Time   11/22/11  5:34 PM      Component Value Range Status Comment   Specimen Description FLUID LEFT KNEE   Final    Special Requests NONE   Final    ACID FAST SMEAR NO ACID FAST  BACILLI SEEN   Final    Culture     Final    Value: CULTURE WILL BE EXAMINED FOR 6 WEEKS BEFORE ISSUING A FINAL REPORT   Report Status PENDING   Incomplete     Studies/Results: Dg Chest 1 View  11/24/2011  *RADIOLOGY REPORT*  Clinical Data: Cough, shortness of breath, pneumonia.  CHEST - 1 VIEW  Comparison: 11/21/2011  Findings: Patchy areas of consolidation throughout both lungs, most pronounced and progressed in the upper lobes.  Small bilateral pleural effusions.  Low lung volumes.  Heart is upper limits normal in size.  IMPRESSION: Worsening patchy bilateral airspace opacities most compatible with multifocal pneumonia.  Original Report Authenticated By: Cyndie Chime, M.D.   Nm Pet Image Initial (pi) Skull Base To  Thigh  11/24/2011  *RADIOLOGY REPORT*  Clinical Data: Initial treatment strategy for lung metastases, unknown primary.  NUCLEAR MEDICINE PET SKULL BASE TO THIGH  Fasting Blood Glucose:  128  Technique:  15.8 mCi F-18 FDG was injected intravenously. CT data was obtained and used for attenuation correction and anatomic localization only.  (This was not acquired as a diagnostic CT examination.) Additional exam technical data entered on technologist worksheet.  Comparison:  CT chest dated 11/17/2011.  CT abdomen pelvis dated 11/18/2011.  Findings:  Neck: No hypermetabolic lymph nodes in the neck.  Chest:  Numerous bilateral pulmonary nodules/opacities, including: --Right apical nodule with max SUV 8.6 (PET image 61) --Left apical nodule with max SUV 7.2 (PET image 63) --Anterior right upper lobe nodule with max SUV 8.4 (PET image 74) --Right basilar nodular opacities with max SUV 13.4 (PET image 96) --Left basilar nodular opacities with max SUV 12.1 (PET image 96)  These findings are worrisome for primary or metastatic malignancy, possibly with superimposed infection at the lung bases.  Abdomen/Pelvis:  Abnormal soft tissue surrounding the lower abdominal aorta and left common iliac artery, as noted on recent CT, with max SUV 6.2 (PET image 189).  No abnormal hypermetabolic activity within the liver, pancreas, adrenal glands, or spleen.  Hypermetabolic focus in the left prostate gland, max SUV 6.1 (PET image 236).  7 mm distal left ureteral calculus.  [place]:  No focal hypermetabolic activity to suggest skeletal metastasis.  IMPRESSION: Multiple bilateral pulmonary nodules, max SUV 8.6, worrisome for primary or metastatic malignancy.  Correlate with scheduled biopsy.  Nodular opacities at the bilateral lung bases, max SUV 13.4, worrisome for malignancy although superimposed infection is not excluded.  No abnormal soft tissues surrounding the lower abdominal aorta and left common iliac artery, max SUV 6.2, worrisome  for malignancy such as lymphoma or nodal metastases.  Hypermetabolic focus in the left prostate gland, max SUV 6.1, suspicious for primary prostatic adenocarcinoma.  Of note, this is considered unrelated to the above findings.  Original Report Authenticated By: Charline Bills, M.D.   Dg Knee Left Port  11/22/2011  *RADIOLOGY REPORT*  Clinical Data: Knee swelling and pain.  PORTABLE LEFT KNEE - 1-2 VIEW  Comparison: None.  Findings: No acute bony or joint abnormality is identified.  There is some medial compartment joint space narrowing.  Small patellofemoral osteophytes are noted.  Very small joint effusion is present.  IMPRESSION:  1.  No acute finding.  2.  Mild to moderate degenerative change, most notable in the medial compartment.  Original Report Authenticated By: Bernadene Bell. Maricela Curet, M.D.   Dg Knee Right Port  11/22/2011  *RADIOLOGY REPORT*  Clinical Data: Knee pain and swelling.  PORTABLE RIGHT KNEE -  1-2 VIEW  Comparison: None.  Findings: No fracture is identified.  Osteochondral lesion along the medial aspect of the medial femoral condyle is noted.  There is some degenerative disease about the knee with near bone-on-bone joint space narrowing in the medial compartment.  Small joint effusion is noted.  IMPRESSION:  1.  No acute finding. 2.  Degenerative disease appearing worst the medial compartment where it is moderate to advanced.  Likely osteochondral lesion medial femoral condyle noted.  Original Report Authenticated By: Bernadene Bell. Maricela Curet, M.D.    Medications:  Scheduled Meds:   . albuterol  2.5 mg Nebulization TID  . albuterol  2.5 mg Nebulization Once  . docusate sodium  100 mg Oral BID  . enoxaparin (LOVENOX) injection  40 mg Subcutaneous Q24H  . feeding supplement  237 mL Oral BID BM  . furosemide  40 mg Intravenous Once  . ipratropium  0.5 mg Nebulization TID  . piperacillin-tazobactam (ZOSYN)  IV  3.375 g Intravenous Q8H  . polyethylene glycol  17 g Oral Daily  . senna  1  tablet Oral BID  . Tamsulosin HCl  0.4 mg Oral Daily  . vancomycin  1,250 mg Intravenous Q12H   Continuous Infusions:   . DISCONTD: sodium chloride 100 mL/hr at 11/24/11 0138   PRN Meds:.acetaminophen, acetaminophen, diphenhydrAMINE, fludeoxyglucose F - 18, HYDROcodone-acetaminophen, HYDROmorphone (DILAUDID) injection, ondansetron (ZOFRAN) IV, ondansetron   Assessment: Principal Problem:  *Metastasis of unknown primary Active Problems:  Weakness generalized  Pulmonary nodule  Polyarthritis  Mouth ulcers  Night sweats  Normocytic anemia   Plan:  #1 migratory polyarthralgias, unclear etiology, Lyme titer, [place] spotted fever, erchliosis , pending, ESR is 69, infectious disease consultation has been obtained, discussed with Dr. Orvan Falconer, for possible Lyme disease, possible endocarditis, Discontinue doxycycline, left knee aspiration was done , synovial fluid analysis was negative for gout, pseudogout, septic arthritis\Cryptococcal antigen, histoplasma antigen and antibody, Blastomyces antigen, and serum ACE level pending  #2 generalized weakness secondary to #1   #3 pulmonary nodules septic versus metastatic versus fungal, PET/CT on Monday , CT-guided biopsy tomorrow, Gram stain, aerobic and anaerobic cultures; AFB stain and culture; and fungal stain and cultures   #4 obstructive uropathy urology does not plan to intervene at this time   LOS: 3 days   [facility] 11/24/2011, 2:09 PM

## 2011-11-24 NOTE — Consult Note (Addendum)
Referring physician: Richarda Overlie Reason for consult: Pulmonary nodules, dyspnea  Chief Complaint  Patient presents with  . Pain    HISTORY of PRESENT ILLNESS:  Robert Mcgee is a 71 y.o. male admitted on 11/21/2011 with fever, polyarthralgia, and found to have multiple pulmonary nodules.  PCCM consulted 5/13 to assess progressive b/l pulmonary infiltrates, dyspnea, and hypoxia.  He developed progress joint pain over the past few weeks.  He also had pleuritic chest pain and felt feverish.  He has lost about 5 lbs.  He was given course of levaquin as outpt after finding a tic on his shoulder.  He had initial improvement after this.  He had CT chest as outpt which showed multiple pulmonary nodules.  He was evaluated by IR for percutaneous biopsy of lung nodules, and PET scan was recommended first.  He had essential normal chest xray 11/04/11.  He was had a terrible sinus infection that last 3 months last fall and did not improve until he was treated with prednisone.  [place]  He was spraying his yard for weeds, and for insects.  His temperature never got above 100F at home.  He had pain in his hips, knees, and ankles b/l.  He did not have any skin rashes or gland swelling.  He has a remote history of smoking.  His ESR was 69 on admission.  CPK, uric acid, LDH normal.  He had mild elevation in TSH (6.679).  ANA, ACE, RF, Hep A/B/C, Cryptococcal Ag all negative.  B burgdorferi Ab IgG + IgM 1.41.  Histoplasma Ab, RMSF IgM/IgG pending.  He has been seen by ID.  He has been maintained on antibiotics.    He was found to have Lt sided distal ureteral stone with mild Lt hydroureter and has been evaluated by urology.  He was seen by orthopedics for his b/l knee swelling.  Xray was consistent with osteoarthritis, and knee aspirate was unremarkable.  Cultures: Blood 5/10>> Urine 5/10>> Lt knee 5/11>> Blood 5/11>>  Antibiotics: Doxycycline 5/10>>5/11 Zosyn 5/10>> Vancomycin  5/10>>5/12  Tests/events 03/13/11 CT abd/pelvis>>8 mm RML nodule 11/17/11 CT chest>>borderline hilar/mediastinal adenopathy, b/l rounded nodules of varying size up to 2.6 cm 11/17/11 CT abd/pelvis>>abnormal soft tissue surrounding bifurcation of abdominal aorta 11/21/11 Scrotal u/s>>no evidence of testicular masses 11/24/11 PET scan>>numerous b/l pulmonary nodules with SUV up to 12.1, SUV 6.2 in abnormal soft tissue around abdominal aorta, SUV 6.1 Lt prostate   11/22/11 Echo>>mild LVH, EF 65 to 70%, grade 1 diastolic dysfx, mild AS, mild LA/RA dilation, PAS 42 mmHg  Past Medical History  Diagnosis Date  . Kidney stone   . Pneumothorax   . Lung nodules     Past Surgical History  Procedure Date  . Varicose vein surgery     History reviewed. No pertinent family history.   reports that he quit smoking about 31 years ago. He has never used smokeless tobacco. He reports that he does not drink alcohol or use illicit drugs.  No Known Allergies  Medications Prior to Admission  Medication Sig Dispense Refill  . acetaminophen (TYLENOL) 500 MG tablet Take 500 mg by mouth every 6 (six) hours as needed. Pain      . B Complex-C (B-COMPLEX WITH VITAMIN C) tablet Take 1 tablet by mouth daily.      Marland Kitchen HYDROcodone-acetaminophen (VICODIN) 5-500 MG per tablet Take 1 tablet by mouth every 6 (six) hours as needed. Pain      . ibuprofen (ADVIL,MOTRIN) 200 MG tablet  Take 200 mg by mouth every 6 (six) hours as needed. pain      . levofloxacin (LEVAQUIN) 500 MG tablet Take 500 mg by mouth daily. Pt started on Monday for 10 day supply. pts on day 4      . Multiple Vitamins-Minerals (MULTIVITAMIN WITH MINERALS) tablet Take 1 tablet by mouth daily.      . Omega-3 Fatty Acids (FISH OIL) 1200 MG CAPS Take 1,200 mg by mouth daily.        ROS: Negative except above.  PHYICAL EXAM:  Blood pressure 160/80, pulse 116, temperature 98.3 F (36.8 C), temperature source Axillary, resp. rate 20, height 6\' 2"  (1.88  m), weight 215 lb (97.523 kg), SpO2 92.00%. Body mass index is 27.60 kg/(m^2).  I/O last 3 completed shifts: In: 480 [P.O.:480] Out: -   Vent Mode:  [-]  FiO2 (%):  [35 %-50 %] 50 %  Physical exam: General - Increased WOB, has to stop to catch breath between words HEENT - PERRLA, EOMI, no sinus tenderness, clear nasal discharge, shallow ulcer Rt upper gum and Lt lateral tongue, no LAN, no thyromegaly Chest - scattered rhonchi, no wheeze, diminished respiratory excursion Cardiac - s1s2 regular, no murmur Abd - soft, mild distention, +bowel sounds, no organomegaly Ext - mild swelling in knees b/l Lt > Rt, full range of motion Neuro - A&O x 3, CN intact, normal strength Psych - normal mood, behavior Skin - no rashes  Lab Results  Component Value Date   WBC 11.4* 11/24/2011   HGB 11.1* 11/24/2011   HCT 34.0* 11/24/2011   MCV 90.4 11/24/2011   PLT 492* 11/24/2011  ,  Lab Results  Component Value Date   CREATININE 1.07 11/22/2011   BUN 11 11/22/2011   NA 136 11/22/2011   K 3.8 11/22/2011   CL 100 11/22/2011   CO2 26 11/22/2011  ,  Lab Results  Component Value Date   ALT 38 11/22/2011   AST 30 11/22/2011   ALKPHOS 192* 11/22/2011   BILITOT 0.5 11/22/2011  ,  Lab Results  Component Value Date   CKTOTAL 44 11/22/2011   CKMB 2.0 11/22/2011   TROPONINI <0.30 11/22/2011   ,  Lab Results  Component Value Date   ESRSEDRATE 69* 11/21/2011  ,  Lab Results  Component Value Date   TSH 6.679* 11/21/2011    ABG    Component Value Date/Time   PHART 7.464* 11/24/2011 0930   HCO3 28.3* 11/24/2011 0930   TCO2 24.1 11/24/2011 0930   O2SAT 92.4 11/24/2011 0930     Dg Chest 1 View  11/24/2011  *RADIOLOGY REPORT*  Clinical Data: Cough, shortness of breath, pneumonia.  CHEST - 1 VIEW  Comparison: 11/21/2011  Findings: Patchy areas of consolidation throughout both lungs, most pronounced and progressed in the upper lobes.  Small bilateral pleural effusions.  Low lung volumes.  Heart is upper limits  normal in size.  IMPRESSION: Worsening patchy bilateral airspace opacities most compatible with multifocal pneumonia.  Original Report Authenticated By: Cyndie Chime, M.D.   Nm Pet Image Initial (pi) Skull Base To Thigh  11/24/2011  *RADIOLOGY REPORT*  Clinical Data: Initial treatment strategy for lung metastases, unknown primary.  NUCLEAR MEDICINE PET SKULL BASE TO THIGH  Fasting Blood Glucose:  128  Technique:  15.8 mCi F-18 FDG was injected intravenously. CT data was obtained and used for attenuation correction and anatomic localization only.  (This was not acquired as a diagnostic CT examination.) Additional exam technical data entered  on technologist worksheet.  Comparison:  CT chest dated 11/17/2011.  CT abdomen pelvis dated 11/18/2011.  Findings:  Neck: No hypermetabolic lymph nodes in the neck.  Chest:  Numerous bilateral pulmonary nodules/opacities, including: --Right apical nodule with max SUV 8.6 (PET image 61) --Left apical nodule with max SUV 7.2 (PET image 63) --Anterior right upper lobe nodule with max SUV 8.4 (PET image 74) --Right basilar nodular opacities with max SUV 13.4 (PET image 96) --Left basilar nodular opacities with max SUV 12.1 (PET image 96)  These findings are worrisome for primary or metastatic malignancy, possibly with superimposed infection at the lung bases.  Abdomen/Pelvis:  Abnormal soft tissue surrounding the lower abdominal aorta and left common iliac artery, as noted on recent CT, with max SUV 6.2 (PET image 189).  No abnormal hypermetabolic activity within the liver, pancreas, adrenal glands, or spleen.  Hypermetabolic focus in the left prostate gland, max SUV 6.1 (PET image 236).  7 mm distal left ureteral calculus.  [place]:  No focal hypermetabolic activity to suggest skeletal metastasis.  IMPRESSION: Multiple bilateral pulmonary nodules, max SUV 8.6, worrisome for primary or metastatic malignancy.  Correlate with scheduled biopsy.  Nodular opacities at the bilateral  lung bases, max SUV 13.4, worrisome for malignancy although superimposed infection is not excluded.  No abnormal soft tissues surrounding the lower abdominal aorta and left common iliac artery, max SUV 6.2, worrisome for malignancy such as lymphoma or nodal metastases.  Hypermetabolic focus in the left prostate gland, max SUV 6.1, suspicious for primary prostatic adenocarcinoma.  Of note, this is considered unrelated to the above findings.  Original Report Authenticated By: Charline Bills, M.D.   Dg Knee Left Port  11/22/2011  *RADIOLOGY REPORT*  Clinical Data: Knee swelling and pain.  PORTABLE LEFT KNEE - 1-2 VIEW  Comparison: None.  Findings: No acute bony or joint abnormality is identified.  There is some medial compartment joint space narrowing.  Small patellofemoral osteophytes are noted.  Very small joint effusion is present.  IMPRESSION:  1.  No acute finding.  2.  Mild to moderate degenerative change, most notable in the medial compartment.  Original Report Authenticated By: Bernadene Bell. Maricela Curet, M.D.   Dg Knee Right Port  11/22/2011  *RADIOLOGY REPORT*  Clinical Data: Knee pain and swelling.  PORTABLE RIGHT KNEE - 1-2 VIEW  Comparison: None.  Findings: No fracture is identified.  Osteochondral lesion along the medial aspect of the medial femoral condyle is noted.  There is some degenerative disease about the knee with near bone-on-bone joint space narrowing in the medial compartment.  Small joint effusion is noted.  IMPRESSION:  1.  No acute finding. 2.  Degenerative disease appearing worst the medial compartment where it is moderate to advanced.  Likely osteochondral lesion medial femoral condyle noted.  Original Report Authenticated By: Bernadene Bell. D'ALESSIO, M.D.   ASSESSMENT/PLAN:   Pulmonary:  Acute hypoxic respiratory failure with progressive b/l pulmonary infiltrates with more peripheral distribution.  This has been associated with symmetric b/l lower extremity polyarthralgia.  He required  prednisone therapy to treat sinus infection in Fall 2012.  I am more concerned about inflammatory process (more likely) vs infectious process.  Malignancy seems less likely given that he had a normal chest xray in April 2013, and has since developed rapidly progressive airspace disease.  He has normal renal fx, and benign U/A.  His ESR was 69 on admission.  CPK, uric acid, LDH normal.  He had mild elevation in TSH (6.679).  ANA,  ACE, RF, Hep A/B/C, Cryptococcal Ag all negative.  B burgdorferi Ab IgG + IgM 1.41.  Histoplasma Ab, RMSF IgM/IgG pending.  Plan: -transfer to ICU under PCCM service -advised against IR percutaneous biopsy of lung lesion>>high risk and low likelihood of yield -d/w ID>>continue current ABx and okay to start steroids -add solumedrol 60 mg IV q6hrs -check ANCA -may need broncho for airway sampling to further assess for infection>>too risky at this point w/o securing airway -explained to family he may need intubation>>try prn BPAP for now -titrate oxygen to keep SpO2 > 92% -f/u CXR -may need thoracic surgery evaluation for VATS biopsy  Cardiac:  Hypertension>>no hx of this as outpt.   -monitor BP and add tx as needed  Renal:  Ureteral stone  -seen by urology -f/u with urology as outpt  Musculoskeletal:  Joint pain -continue pain meds -should improve with steroids -f/u culture results from knee tap  Gastro-intestinal:  Constipation, Mouth ulcers, abnormal soft tissue swelling around abdominal aorta -NPO pending possible bronchoscopy -continue bowel regimen -monitor oral exam -may need additional biopsy of soft tissue mass around abdominal aorta  Endocrine: Steroid induced hyperglycemia -SSI  Mild elevation in TSH -will need further assessment as outpt  Best Practice/disposition: -Transfer to ICU under PCCM service -NPO -Lovenox for DVT prophylaxis -Protonix for SUP  Updated family at bedside  Critical care time 60  minutes.   Ellesse Antenucci 11/24/2011, 3:10 PM Pager:  [phone]

## 2011-11-25 ENCOUNTER — Inpatient Hospital Stay (HOSPITAL_COMMUNITY): Payer: Medicare Other

## 2011-11-25 DIAGNOSIS — R5383 Other fatigue: Secondary | ICD-10-CM

## 2011-11-25 LAB — CBC
Hemoglobin: 12.1 g/dL — ABNORMAL LOW (ref 13.0–17.0)
MCH: 30 pg (ref 26.0–34.0)
Platelets: 512 10*3/uL — ABNORMAL HIGH (ref 150–400)
RBC: 4.03 MIL/uL — ABNORMAL LOW (ref 4.22–5.81)
WBC: 12.1 10*3/uL — ABNORMAL HIGH (ref 4.0–10.5)

## 2011-11-25 LAB — COMPREHENSIVE METABOLIC PANEL
Albumin: 1.8 g/dL — ABNORMAL LOW (ref 3.5–5.2)
Alkaline Phosphatase: 277 U/L — ABNORMAL HIGH (ref 39–117)
BUN: 19 mg/dL (ref 6–23)
Calcium: 8.8 mg/dL (ref 8.4–10.5)
Creatinine, Ser: 0.87 mg/dL (ref 0.50–1.35)
GFR calc Af Amer: >90 mL/min (ref >90)
Glucose, Bld: 156 mg/dL — ABNORMAL HIGH (ref 70–99)
Potassium: 3.7 mEq/L (ref 3.5–5.1)
Total Protein: 6.6 g/dL (ref 6.0–8.3)

## 2011-11-25 LAB — GLUCOSE, CAPILLARY
Glucose-Capillary: 162 mg/dL — ABNORMAL HIGH (ref 70–99)
Glucose-Capillary: 157 mg/dL — ABNORMAL HIGH (ref 70–99)
Glucose-Capillary: 269 mg/dL — ABNORMAL HIGH (ref 70–99)
Glucose-Capillary: 275 mg/dL — ABNORMAL HIGH (ref 70–99)

## 2011-11-25 LAB — DIFFERENTIAL
Lymphocytes Relative: 10 % — ABNORMAL LOW (ref 12–46)
Lymphs Abs: 1.2 10*3/uL (ref 0.7–4.0)
Monocytes Relative: 2 % — ABNORMAL LOW (ref 3–12)
Neutro Abs: 10.7 10*3/uL — ABNORMAL HIGH (ref 1.7–7.7)
Neutrophils Relative %: 88 % — ABNORMAL HIGH (ref 43–77)

## 2011-11-25 LAB — B. BURGDORFI ANTIBODIES BY WB: B burgdorferi IgM Abs (IB): NEGATIVE

## 2011-11-25 LAB — EXPECTORATED SPUTUM ASSESSMENT W GRAM STAIN, RFLX TO RESP C

## 2011-11-25 LAB — PSA: PSA: 1.48 ng/mL (ref <=4.00)

## 2011-11-25 LAB — CEA: CEA: <0.5 ng/mL (ref 0.0–5.0)

## 2011-11-25 MED ORDER — POTASSIUM CHLORIDE CRYS ER 20 MEQ PO TBCR
40.0000 meq | EXTENDED_RELEASE_TABLET | Freq: Once | ORAL | Status: AC
  Administered 2011-11-25: 40 meq via ORAL
  Filled 2011-11-25: qty 2

## 2011-11-25 NOTE — Progress Notes (Signed)
Referring physician: Richarda Overlie Reason for consult: Pulmonary nodules, dyspnea        PCCM Progress Note  HISTORY of PRESENT ILLNESS:  Robert Mcgee is a 71 y.o. male admitted on 11/21/2011 with fever, polyarthralgia, and found to have multiple pulmonary nodules.  PCCM consulted 5/13 to assess progressive b/l pulmonary infiltrates, dyspnea, and hypoxia.  PET/CT with diffuse uptake.  Started on high dose steroids and abx upon admission.  Cultures: Blood 5/10>> Urine 5/10>> Lt knee 5/11>> Blood 5/11>>  Antibiotics: Doxycycline 5/10>>5/11 Zosyn 5/10>> Vancomycin 5/10>>  Tests/events 03/13/11 CT abd/pelvis>>8 mm RML nodule 11/17/11 CT chest>>borderline hilar/mediastinal adenopathy, b/l rounded nodules of varying size up to 2.6 cm 11/17/11 CT abd/pelvis>>abnormal soft tissue surrounding bifurcation of abdominal aorta 11/21/11 Scrotal u/s>>no evidence of testicular masses 11/24/11 PET scan>>numerous b/l pulmonary nodules with SUV up to 12.1, SUV 6.2 in abnormal soft tissue around abdominal aorta, SUV 6.1 Lt prostate   11/22/11 Echo>>mild LVH, EF 65 to 70%, grade 1 diastolic dysfx, mild AS, mild LA/RA dilation, PAS 42 mmHg  PHYICAL EXAM:  Blood pressure 130/68, pulse 85, temperature 97.1 F (36.2 C), temperature source Axillary, resp. rate 19, height 6\' 2"  (1.88 m), weight 97.3 kg (214 lb 8.1 oz), SpO2 89.00%. Body mass index is 27.54 kg/(m^2).  I/O last 3 completed shifts: In: 1097.5 [I.V.:600; IV Piggyback:497.5] Out: 3066 [Urine:3065; Stool:1]  Intake/Output Summary (Last 24 hours) at 11/25/11 0909 Last data filed at 11/25/11 0900  Gross per 24 hour  Intake   1410 ml  Output   3341 ml  Net  -1931 ml   Vent Mode:  [-]  FiO2 (%):  [40 %-50 %] 50 %  Physical exam: General - Increased WOB, has to stop to catch breath between words HEENT - PERRLA, EOMI, no sinus tenderness, clear nasal discharge, shallow ulcer Rt upper gum and Lt lateral tongue, no LAN, no thyromegaly Chest  - scattered rhonchi, no wheeze, diminished respiratory excursion Cardiac - s1s2 regular, no murmur Abd - soft, mild distention, +bowel sounds, no organomegaly Ext - mild swelling in knees b/l Lt > Rt, full range of motion Neuro - A&O x 3, CN intact, normal strength Psych - normal mood, behavior Skin - no rashes  Lab Results  Component Value Date   WBC 12.1* 11/25/2011   HGB 12.1* 11/25/2011   HCT 36.1* 11/25/2011   MCV 89.6 11/25/2011   PLT 512* 11/25/2011  ,  Lab Results  Component Value Date   CREATININE 0.87 11/25/2011   BUN 19 11/25/2011   NA 137 11/25/2011   K 3.7 11/25/2011   CL 98 11/25/2011   CO2 30 11/25/2011  ,  Lab Results  Component Value Date   ALT 22 11/25/2011   AST 26 11/25/2011   ALKPHOS 277* 11/25/2011   BILITOT 0.5 11/25/2011  ,  Lab Results  Component Value Date   CKTOTAL 44 11/22/2011   CKMB 2.0 11/22/2011   TROPONINI <0.30 11/22/2011   ,  Lab Results  Component Value Date   ESRSEDRATE 69* 11/21/2011  ,  Lab Results  Component Value Date   TSH 6.679* 11/21/2011    ABG    Component Value Date/Time   PHART 7.459* 11/24/2011 2010   HCO3 30.2* 11/24/2011 2010   TCO2 27.1 11/24/2011 2010   O2SAT 95.1 11/24/2011 2010     Dg Chest 1 View  11/24/2011  *RADIOLOGY REPORT*  Clinical Data: Cough, shortness of breath, pneumonia.  CHEST - 1 VIEW  Comparison: 11/21/2011  Findings: Patchy areas  of consolidation throughout both lungs, most pronounced and progressed in the upper lobes.  Small bilateral pleural effusions.  Low lung volumes.  Heart is upper limits normal in size.  IMPRESSION: Worsening patchy bilateral airspace opacities most compatible with multifocal pneumonia.  Original Report Authenticated By: Cyndie Chime, M.D.   Nm Pet Image Initial (pi) Skull Base To Thigh  11/24/2011  *RADIOLOGY REPORT*  Clinical Data: Initial treatment strategy for lung metastases, unknown primary.  NUCLEAR MEDICINE PET SKULL BASE TO THIGH  Fasting Blood Glucose:  128  Technique:   15.8 mCi F-18 FDG was injected intravenously. CT data was obtained and used for attenuation correction and anatomic localization only.  (This was not acquired as a diagnostic CT examination.) Additional exam technical data entered on technologist worksheet.  Comparison:  CT chest dated 11/17/2011.  CT abdomen pelvis dated 11/18/2011.  Findings:  Neck: No hypermetabolic lymph nodes in the neck.  Chest:  Numerous bilateral pulmonary nodules/opacities, including: --Right apical nodule with max SUV 8.6 (PET image 61) --Left apical nodule with max SUV 7.2 (PET image 63) --Anterior right upper lobe nodule with max SUV 8.4 (PET image 74) --Right basilar nodular opacities with max SUV 13.4 (PET image 96) --Left basilar nodular opacities with max SUV 12.1 (PET image 96)  These findings are worrisome for primary or metastatic malignancy, possibly with superimposed infection at the lung bases.  Abdomen/Pelvis:  Abnormal soft tissue surrounding the lower abdominal aorta and left common iliac artery, as noted on recent CT, with max SUV 6.2 (PET image 189).  No abnormal hypermetabolic activity within the liver, pancreas, adrenal glands, or spleen.  Hypermetabolic focus in the left prostate gland, max SUV 6.1 (PET image 236).  7 mm distal left ureteral calculus.  [place]:  No focal hypermetabolic activity to suggest skeletal metastasis.  IMPRESSION: Multiple bilateral pulmonary nodules, max SUV 8.6, worrisome for primary or metastatic malignancy.  Correlate with scheduled biopsy.  Nodular opacities at the bilateral lung bases, max SUV 13.4, worrisome for malignancy although superimposed infection is not excluded.  No abnormal soft tissues surrounding the lower abdominal aorta and left common iliac artery, max SUV 6.2, worrisome for malignancy such as lymphoma or nodal metastases.  Hypermetabolic focus in the left prostate gland, max SUV 6.1, suspicious for primary prostatic adenocarcinoma.  Of note, this is considered unrelated to  the above findings.  Original Report Authenticated By: Charline Bills, M.D.   Dg Chest Port 1 View  11/25/2011  *RADIOLOGY REPORT*  Clinical Data: Infiltrates.  Follow-up.  PORTABLE CHEST - 1 VIEW  Comparison: 11/24/2011.  Findings: Low volume chest.  Multifocal patchy airspace disease appears little changed compared to prior.  Cardiopericardial silhouette and mediastinal contours are unchanged.  IMPRESSION: Unchanged multi focal airspace disease.  Low volume chest.  Original Report Authenticated By: Andreas Newport, M.D.   Dg Chest Port 1 View  11/24/2011  *RADIOLOGY REPORT*  Clinical Data: Shortness of breath.  Fever.  Respiratory failure.  PORTABLE CHEST - 1 VIEW  Comparison: Prior exam from 11/24/2010  Findings: Bilateral relatively indistinct lung masses/opacities are again noted.  Blunting of both costophrenic angles noted.  There is increasing interstitial accentuation in the lungs favoring interstitial edema.  Thoracic spondylosis noted.  IMPRESSION:  1.  In addition to the scattered bilateral rounded pulmonary opacities, there is interstitial accentuation in the lungs favoring interstitial edema.  The opacities themselves were hypermetabolic on today's PET CT and could represent rapidly progressive metastatic disease or granulomatous infection.  2.  Small  bilateral pleural effusions.  Original Report Authenticated By: Dellia Cloud, M.D.   ASSESSMENT/PLAN:   Pulmonary:  Acute hypoxic respiratory failure with progressive b/l pulmonary infiltrates with more peripheral distribution.  This has been associated with symmetric b/l lower extremity polyarthralgia.  He required prednisone therapy to treat sinus infection in Fall 2012.  I am more concerned about inflammatory process (more likely) vs infectious process.  Malignancy seems less likely given that he had a normal chest xray in April 2013, and has since developed rapidly progressive airspace disease.  He has normal renal fx, and benign  U/A.  His ESR was 69 on admission.  CPK, uric acid, LDH normal.  He had mild elevation in TSH (6.679).  ANA, ACE, RF, Hep A/B/C, Cryptococcal Ag all negative.  B burgdorferi Ab IgG + IgM 1.41.  Histoplasma Ab, RMSF IgM/IgG pending.  Plan: - Hold in ICU until decision on prognostication/diagnosis. - No IR lung biopsy. - D/W ID>>continue current ABx and okay to start steroids, patient is thinking about bronch but would like to speak with family first and discuss plan of care. - Continue solumedrol 60 mg IV q6hrs. - ANCA pending. - Suggested bronch for culture, but unfortunately O2 demand is so high that we would have to intubate for bronch.  Patient did not want that.  He understands the risks taken by not doing bronch and is agreeable to sputum induction. - After discussion on need for biopsy vs conservative measure, all questions were answered and family is discussing options.  They are fully aware that conservative approach of steroids then wait and see does not answer the question of cancer however unlikely it is. - Attempt to get down to 50% mask, will keep BiPAP for work of breathing and Sat of 88-92% is perfectly acceptable. - F/u CXR in AM. - May need thoracic surgery evaluation for VATS biopsy pending family discussion.  Cardiac:  Hypertension>>no hx of this as outpt.   - Monitor BP and add tx as needed specially with steroid use. - 2D echo.  Renal: Ureteral stone  - Seen by urology - F/u with urology as outpt  Musculoskeletal:  Joint pain - Continue pain meds - Should improve with steroids - F/u culture results from knee tap  Gastro-intestinal:  Constipation, Mouth ulcers, abnormal soft tissue swelling around abdominal aorta - Start diet today, will not bronch today. - Continue bowel regimen. - Monitor oral exam. - May need additional biopsy of soft tissue mass around abdominal aorta or at least re-image.  Endocrine: Steroid induced hyperglycemia - SSI  Mild  elevation in TSH - Will need further assessment as outpt.  Best Practice/disposition: -Transfer to ICU under PCCM service. -Heart healthy diet. -Lovenox for DVT prophylaxis. -Protonix for SUP.  Updated family and patient at bedside, family is discussing options right now.  Critical care time 45 minutes.  Robert Mcgee 11/25/2011, 9:07 AM Pager:  [phone]

## 2011-11-26 ENCOUNTER — Inpatient Hospital Stay (HOSPITAL_COMMUNITY): Payer: Medicare Other

## 2011-11-26 LAB — GLUCOSE, CAPILLARY
Glucose-Capillary: 194 mg/dL — ABNORMAL HIGH (ref 70–99)
Glucose-Capillary: 199 mg/dL — ABNORMAL HIGH (ref 70–99)
Glucose-Capillary: 241 mg/dL — ABNORMAL HIGH (ref 70–99)

## 2011-11-26 LAB — CBC
Platelets: 596 10*3/uL — ABNORMAL HIGH (ref 150–400)
RBC: 3.66 MIL/uL — ABNORMAL LOW (ref 4.22–5.81)
RDW: 14.8 % (ref 11.5–15.5)
WBC: 22.3 10*3/uL — ABNORMAL HIGH (ref 4.0–10.5)

## 2011-11-26 LAB — BODY FLUID CULTURE: Gram Stain: NONE SEEN

## 2011-11-26 LAB — BASIC METABOLIC PANEL
CO2: 29 mEq/L (ref 19–32)
Chloride: 100 mEq/L (ref 96–112)
GFR calc Af Amer: >90 mL/min (ref >90)
Potassium: 3.4 mEq/L — ABNORMAL LOW (ref 3.5–5.1)
Sodium: 138 mEq/L (ref 135–145)

## 2011-11-26 LAB — PHOSPHORUS: Phosphorus: 2.2 mg/dL — ABNORMAL LOW (ref 2.3–4.6)

## 2011-11-26 LAB — MAGNESIUM: Magnesium: 2.2 mg/dL (ref 1.5–2.5)

## 2011-11-26 MED ORDER — FENTANYL CITRATE 0.05 MG/ML IJ SOLN
25.0000 ug | INTRAMUSCULAR | Status: DC | PRN
  Administered 2011-11-26 – 2011-11-27 (×8): 50 ug via INTRAVENOUS
  Filled 2011-11-26 (×8): qty 2

## 2011-11-26 MED ORDER — POTASSIUM PHOSPHATE DIBASIC 3 MMOLE/ML IV SOLN
30.0000 mmol | Freq: Once | INTRAVENOUS | Status: AC
  Administered 2011-11-26: 30 mmol via INTRAVENOUS
  Filled 2011-11-26: qty 10

## 2011-11-26 MED ORDER — POTASSIUM CHLORIDE 20 MEQ/15ML (10%) PO LIQD
40.0000 meq | Freq: Three times a day (TID) | ORAL | Status: AC
  Administered 2011-11-26 (×2): 40 meq
  Filled 2011-11-26 (×2): qty 30

## 2011-11-26 MED ORDER — FUROSEMIDE 10 MG/ML IJ SOLN
40.0000 mg | Freq: Four times a day (QID) | INTRAMUSCULAR | Status: AC
  Administered 2011-11-26 (×2): 40 mg via INTRAVENOUS
  Filled 2011-11-26 (×2): qty 4

## 2011-11-26 MED ORDER — DEXTROSE 5 % IV SOLN
10.0000 mmol | Freq: Once | INTRAVENOUS | Status: DC
  Administered 2011-11-26: 10 mmol via INTRAVENOUS
  Filled 2011-11-26: qty 3.33

## 2011-11-26 NOTE — Progress Notes (Signed)
Inpatient Diabetes Program Recommendations  AACE/ADA: New Consensus Statement on Inpatient Glycemic Control (2009)  Target Ranges:  Prepandial:   less than 140 mg/dL      Peak postprandial:   less than 180 mg/dL (1-2 hours)      Critically ill patients:  140 - 180 mg/dL   Reason for Visit: Results for MINARD, MILLIRONS (MRN [id number]) as of 11/26/2011 13:16  Ref. Range 11/25/2011 19:29 11/26/2011 00:09 11/26/2011 03:55 11/26/2011 08:13 11/26/2011 12:00  Glucose-Capillary Latest Range: 70-99 mg/dL 902 (H) 409 (H) 735 (H) 177 (H) 213 (H)   Note no history of diabetes, however CBG's elevated with IV steroids.  Patient now on PO diet.  Consider changing diet to CHO modified medium.  Also change correction Novolog schedule to tid with meals and HS scale.  Will likely need Novolog meal coverage 3 units tid with meals to cover CHO intake while on steroids. Consider checking HgBA1c to determine prehospitalization glycemic control.

## 2011-11-26 NOTE — Progress Notes (Addendum)
INFECTIOUS DISEASE PROGRESS NOTE  ID: Robert Mcgee is a 71 y.o. 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 On vanco/piptazo  Subjective: Slept very little overnight, difficulty with VM. Unstable for IR bx per primary team. No fevers since starting high dose steroids. Last fever of 39.4 on evening of 13th. Family somewhat frustrated with hearing various reports from different providers, especially since pet scan was read out as malignancy. They report having a good rapport with pulmonologist and trust their opinion.  Abtx:  vanco#6 piptazo#6  Medications:     . antiseptic oral rinse  15 mL Mouth Rinse q12n4p  . chlorhexidine  15 mL Mouth Rinse BID  . docusate sodium  100 mg Oral BID  . enoxaparin (LOVENOX) injection  40 mg Subcutaneous Q24H  . feeding supplement  237 mL Oral BID BM  . furosemide  40 mg Intravenous Q6H  . insulin aspart  0-20 Units Subcutaneous Q4H  . methylPREDNISolone (SOLU-MEDROL) injection  60 mg Intravenous Q6H  . pantoprazole (PROTONIX) IV  40 mg Intravenous Q24H  . piperacillin-tazobactam (ZOSYN)  IV  3.375 g Intravenous Q8H  . polyethylene glycol  17 g Oral Daily  . potassium chloride  40 mEq Per Tube TID  . potassium phosphate IVPB (mmol)  30 mmol Intravenous Once  . senna  1 tablet Oral BID  . Tamsulosin HCl  0.4 mg Oral Daily  . vancomycin  1,000 mg Intravenous Q8H  . DISCONTD: potassium phosphate IVPB (mmol)  10 mmol Intravenous Once    Objective: Vital signs in last 24 hours: Temp:  [97.4 F (36.3 C)-97.7 F (36.5 C)] 97.5 F (36.4 C) (05/15 1200) Pulse Rate:  [79-88] 85  (05/15 1434) Resp:  [14-27] 25  (05/15 1434) BP: (133-150)/(68-85) 141/71 mmHg (05/15 1434) SpO2:  [89 %-93 %] 91 % (05/15 1434) FiO2 (%):  [40 %] 40 % (05/15 1434) Weight:  [212 lb 4.9 oz (96.3 kg)] 212 lb 4.9 oz (96.3 kg) (05/15 0000)  Gen= sleeping, HEENT= dry oral mucosa with ventimask, Kirby/AT, PERRLA, EOMI Neck= supple Pulm=  distant breath sounds, mild rhonchi appreciated  Lab Results  [facility] 11/26/11 0417 11/25/11 0337  WBC 22.3* 12.1*  HGB 10.9* 12.1*  HCT 32.8* 36.1*  NA 138 137  K 3.4* 3.7  CL 100 98  CO2 29 30  BUN 26* 19  CREATININE 0.80 0.87  GLU -- --   Liver Panel  Basename 11/25/11 0337  PROT 6.6  ALBUMIN 1.8*  AST 26  ALT 22  ALKPHOS 277*  BILITOT 0.5  BILIDIR --  IBILI --    Microbiology: Recent Results (from the past 240 hour(s))  CULTURE, BLOOD (ROUTINE X 2)     Status: Normal (Preliminary result)   Collection Time   11/21/11  9:50 AM      Component Value Range Status Comment   Specimen Description BLOOD RIGHT ANTECUBITAL   Final    Special Requests BOTTLES DRAWN AEROBIC AND ANAEROBIC   Final    Culture  Setup Time [id number]   Final    Culture     Final    Value:        BLOOD CULTURE RECEIVED NO GROWTH TO DATE CULTURE WILL BE HELD FOR 5 DAYS BEFORE ISSUING A FINAL NEGATIVE REPORT   Report Status PENDING   Incomplete   CULTURE, BLOOD (ROUTINE X 2)     Status: Normal (Preliminary result)   Collection Time   11/21/11  9:50 AM  Component Value Range Status Comment   Specimen Description BLOOD LEFT FOREARM   Final    Special Requests BOTTLES DRAWN AEROBIC AND ANAEROBIC   Final    Culture  Setup Time [id number]   Final    Culture     Final    Value:        BLOOD CULTURE RECEIVED NO GROWTH TO DATE CULTURE WILL BE HELD FOR 5 DAYS BEFORE ISSUING A FINAL NEGATIVE REPORT   Report Status PENDING   Incomplete   URINE CULTURE     Status: Normal   Collection Time   11/21/11 12:44 PM      Component Value Range Status Comment   Specimen Description URINE, CLEAN CATCH   Final    Special Requests NONE   Final    Culture  Setup Time [id number]   Final    Colony Count NO GROWTH   Final    Culture NO GROWTH   Final    Report Status 11/23/2011 FINAL   Final   BODY FLUID CULTURE     Status: Normal   Collection Time   11/22/11  4:54 PM      Component Value Range  Status Comment   Specimen Description KNEE   Final    Special Requests NONE   Final    Gram Stain     Final    Value: NO WBC SEEN     NO ORGANISMS SEEN   Culture NO GROWTH 3 DAYS   Final    Report Status 11/26/2011 FINAL   Final   AFB CULTURE WITH SMEAR     Status: Normal (Preliminary result)   Collection Time   11/22/11  5:34 PM      Component Value Range Status Comment   Specimen Description FLUID LEFT KNEE   Final    Special Requests NONE   Final    ACID FAST SMEAR NO ACID FAST BACILLI SEEN   Final    Culture     Final    Value: CULTURE WILL BE EXAMINED FOR 6 WEEKS BEFORE ISSUING A FINAL REPORT   Report Status PENDING   Incomplete   MRSA PCR SCREENING     Status: Normal   Collection Time   11/24/11  5:47 PM      Component Value Range Status Comment   MRSA by PCR NEGATIVE  NEGATIVE  Final   CULTURE, EXPECTORATED SPUTUM-ASSESSMENT     Status: Normal   Collection Time   11/25/11 11:35 AM      Component Value Range Status Comment   Specimen Description SPUTUM   Final    Special Requests Normal   Final    Sputum evaluation     Final    Value: THIS SPECIMEN IS ACCEPTABLE. RESPIRATORY CULTURE REPORT TO FOLLOW.   Report Status 11/25/2011 FINAL   Final   CULTURE, RESPIRATORY     Status: Normal (Preliminary result)   Collection Time   11/25/11 11:35 AM      Component Value Range Status Comment   Specimen Description SPUTUM   Final    Special Requests NONE   Final    Gram Stain     Final    Value: RARE WBC PRESENT,BOTH PMN AND MONONUCLEAR     FEW SQUAMOUS EPITHELIAL CELLS PRESENT     FEW YEAST   Culture Culture reincubated for better growth   Final    Report Status PENDING   Incomplete    RMSF negative  Studies/Results: 11/24/2011  PET SCAN: IMPRESSION:  Multiple bilateral pulmonary nodules, max SUV 8.6, worrisome for  primary or metastatic malignancy. Correlate with scheduled biopsy.  Nodular opacities at the bilateral lung bases, max SUV 13.4,  worrisome for malignancy  although superimposed infection is not  excluded.  No abnormal soft tissues surrounding the lower abdominal aorta and  left common iliac artery, max SUV 6.2, worrisome for malignancy  such as lymphoma or nodal metastases.  Hypermetabolic focus in the left prostate gland, max SUV 6.1,  suspicious for primary prostatic adenocarcinoma. Of note, this is  considered unrelated to the above findings.    Assessment/Plan: - on day 6  for empiric coverage. Would need to make the difficult decision to get tissue for path and culture via VATS by thoracic Surgery. I don't think we can avoid patient not getting intubated at least temporary for these procedures. We need to have some direction with either antibiotics and infectious work-up and confirmation of auto-immune process such as wegeners within the next few days.  - please send tissue for fungal, mycobacterial and aerobic culture in addition to path.  Diffuse arthralgias= has also been seen with gouty flare, but no crystals on aspirate, serum uric acid is low. But would expect improvement on steroids.  Await ANCA testing, thus far RF slightly elevated at 18 but ANA negative. Awaiting WB on [email or web address]    Johntavius Shepard Infectious Diseases 11/26/2011, 4:06 PM

## 2011-11-26 NOTE — Progress Notes (Signed)
eLink Physician-Brief Progress Note Patient Name: Robert Mcgee DOB: [birth year] MRN: [id number]  Date of Service  11/26/2011   HPI/Events of Note   Pain not well controlled  eICU Interventions  Fentanyl increased    Intervention Category Intermediate Interventions: Pain - evaluation and management  Shan Levans 11/26/2011, 9:59 PM

## 2011-11-26 NOTE — Progress Notes (Signed)
ANTIBIOTIC CONSULT NOTE - FOLLOW UP  Pharmacy Consult for Vancomycin/Zosyn Indication: r/o PNA  No Known Allergies  Patient Measurements: Height: 6\' 2"  (188 cm) Weight: 212 lb 4.9 oz (96.3 kg) IBW/kg (Calculated) : 82.2   Vital Signs: Temp: 97.5 F (36.4 C) (05/15 1200) Temp src: Axillary (05/15 1200) BP: 141/71 mmHg (05/15 1434) Pulse Rate: 85  (05/15 1434) Intake/Output from previous day: 05/14 0701 - 05/15 0700 In: 2229.5 [P.O.:240; I.V.:1200; IV Piggyback:789.5] Out: 1000 [Urine:1000] Intake/Output from this shift: Total I/O In: 771.7 [P.O.:390; I.V.:71.7; IV Piggyback:310] Out: 2045 [Urine:2045]  Labs:  [facility] 11/26/11 0417 11/25/11 0337 11/24/11 0805  WBC 22.3* 12.1* 11.4*  HGB 10.9* 12.1* 11.1*  PLT 596* 512* 492*  LABCREA -- -- --  CREATININE 0.80 0.87 --   Estimated Creatinine Clearance: 98.5 ml/min (by C-G formula based on Cr of 0.8).  Basename 11/26/11 1530 11/24/11 1411  VANCOTROUGH 18.4 9.7*  VANCOPEAK -- --  Drue Dun -- --  GENTTROUGH -- --  GENTPEAK -- --  GENTRANDOM -- --  TOBRATROUGH -- --  TOBRAPEAK -- --  TOBRARND -- --  AMIKACINPEAK -- --  AMIKACINTROU -- --  AMIKACIN -- --     Microbiology: Recent Results (from the past 720 hour(s))  CULTURE, BLOOD (ROUTINE X 2)     Status: Normal (Preliminary result)   Collection Time   11/21/11  9:50 AM      Component Value Range Status Comment   Specimen Description BLOOD RIGHT ANTECUBITAL   Final    Special Requests BOTTLES DRAWN AEROBIC AND ANAEROBIC   Final    Culture  Setup Time [id number]   Final    Culture     Final    Value:        BLOOD CULTURE RECEIVED NO GROWTH TO DATE CULTURE WILL BE HELD FOR 5 DAYS BEFORE ISSUING A FINAL NEGATIVE REPORT   Report Status PENDING   Incomplete   CULTURE, BLOOD (ROUTINE X 2)     Status: Normal (Preliminary result)   Collection Time   11/21/11  9:50 AM      Component Value Range Status Comment   Specimen Description BLOOD LEFT FOREARM   Final     Special Requests BOTTLES DRAWN AEROBIC AND ANAEROBIC   Final    Culture  Setup Time [id number]   Final    Culture     Final    Value:        BLOOD CULTURE RECEIVED NO GROWTH TO DATE CULTURE WILL BE HELD FOR 5 DAYS BEFORE ISSUING A FINAL NEGATIVE REPORT   Report Status PENDING   Incomplete   URINE CULTURE     Status: Normal   Collection Time   11/21/11 12:44 PM      Component Value Range Status Comment   Specimen Description URINE, CLEAN CATCH   Final    Special Requests NONE   Final    Culture  Setup Time [id number]   Final    Colony Count NO GROWTH   Final    Culture NO GROWTH   Final    Report Status 11/23/2011 FINAL   Final   BODY FLUID CULTURE     Status: Normal   Collection Time   11/22/11  4:54 PM      Component Value Range Status Comment   Specimen Description KNEE   Final    Special Requests NONE   Final    Gram Stain     Final    Value: NO  WBC SEEN     NO ORGANISMS SEEN   Culture NO GROWTH 3 DAYS   Final    Report Status 11/26/2011 FINAL   Final   AFB CULTURE WITH SMEAR     Status: Normal (Preliminary result)   Collection Time   11/22/11  5:34 PM      Component Value Range Status Comment   Specimen Description FLUID LEFT KNEE   Final    Special Requests NONE   Final    ACID FAST SMEAR NO ACID FAST BACILLI SEEN   Final    Culture     Final    Value: CULTURE WILL BE EXAMINED FOR 6 WEEKS BEFORE ISSUING A FINAL REPORT   Report Status PENDING   Incomplete   MRSA PCR SCREENING     Status: Normal   Collection Time   11/24/11  5:47 PM      Component Value Range Status Comment   MRSA by PCR NEGATIVE  NEGATIVE  Final   CULTURE, EXPECTORATED SPUTUM-ASSESSMENT     Status: Normal   Collection Time   11/25/11 11:35 AM      Component Value Range Status Comment   Specimen Description SPUTUM   Final    Special Requests Normal   Final    Sputum evaluation     Final    Value: THIS SPECIMEN IS ACCEPTABLE. RESPIRATORY CULTURE REPORT TO FOLLOW.   Report Status 11/25/2011  FINAL   Final   CULTURE, RESPIRATORY     Status: Normal (Preliminary result)   Collection Time   11/25/11 11:35 AM      Component Value Range Status Comment   Specimen Description SPUTUM   Final    Special Requests NONE   Final    Gram Stain     Final    Value: RARE WBC PRESENT,BOTH PMN AND MONONUCLEAR     FEW SQUAMOUS EPITHELIAL CELLS PRESENT     FEW YEAST   Culture Culture reincubated for better growth   Final    Report Status PENDING   Incomplete     Anti-infectives     Start     Dose/Rate Route Frequency Ordered Stop   11/24/11 1600   vancomycin (VANCOCIN) IVPB 1000 mg/200 mL premix        1,000 mg 200 mL/hr over 60 Minutes Intravenous Every 8 hours 11/24/11 1521     11/21/11 2300   vancomycin (VANCOCIN) 1,250 mg in sodium chloride 0.9 % 250 mL IVPB  Status:  Discontinued        1,250 mg 166.7 mL/hr over 90 Minutes Intravenous Every 12 hours 11/21/11 1412 11/24/11 1521   11/21/11 2200  piperacillin-tazobactam (ZOSYN) IVPB 3.375 g       3.375 g 12.5 mL/hr over 240 Minutes Intravenous 3 times per day 11/21/11 1349     11/21/11 1600   doxycycline (VIBRAMYCIN) 100 mg in dextrose 5 % 250 mL IVPB  Status:  Discontinued        100 mg 125 mL/hr over 120 Minutes Intravenous Every 12 hours 11/21/11 1519 11/23/11 1152   11/21/11 1330  piperacillin-tazobactam (ZOSYN) IVPB 3.375 g       3.375 g 100 mL/hr over 30 Minutes Intravenous To Emergency Dept 11/21/11 1248 11/21/11 1405   11/21/11 1330   vancomycin (VANCOCIN) IVPB 1000 mg/200 mL premix        1,000 mg 200 mL/hr over 60 Minutes Intravenous To Emergency Dept 11/21/11 1248 11/21/11 1502  Assessment: Day # 6 Vancomycin 1g IV q8h - this is a dose increase from 1250mg  IV q12h d/t low trough on 5/13 Day # 6 Zosyn extended infusion 3.375g IV q8h Scr wnl/stable WBC up/elevated, Afebrile Vancomycin trough is within goal range Cultures to date are negative   Goal of Therapy:  Vancomycin trough level 15-20  mcg/ml  Plan:  No change to current doses  Continue to monitor  Gwen Her PharmD  [phone] 11/26/2011 4:57 PM

## 2011-11-26 NOTE — Progress Notes (Signed)
eLink Physician-Brief Progress Note Patient Name: Darnel Mchan DOB: [birth year] MRN: [id number]  Date of Service  11/26/2011   HPI/Events of Note   Lab 11/26/11 0417 11/25/11 0337 11/22/11 0520 11/21/11 0950  NA 138 137 136 134*  K 3.4* 3.7 -- --  CL 100 98 100 97  CO2 29 30 26 24   GLUCOSE 189* 156* 140* 202*  BUN 26* 19 11 16   CREATININE 0.80 0.87 1.07 0.97  CALCIUM 8.6 8.8 8.7 9.3  MG 2.2 -- -- --  PHOS 2.2* -- -- --      eICU Interventions  Replete k and phos   Intervention Category Intermediate Interventions: Electrolyte abnormality - evaluation and management  Jemario Poitras 11/26/2011, 5:58 AM

## 2011-11-26 NOTE — Progress Notes (Signed)
Referring physician: Richarda Overlie Reason for consult: Pulmonary nodules, dyspnea        PCCM Progress Note  HISTORY of PRESENT ILLNESS:  Robert Mcgee is a 71 y.o. male admitted on 11/21/2011 with fever, polyarthralgia, and found to have multiple pulmonary nodules.  PCCM consulted 5/13 to assess progressive b/l pulmonary infiltrates, dyspnea, and hypoxia.  PET/CT with diffuse uptake.  Started on high dose steroids and abx upon admission.  Cultures: Blood 5/10>> Urine 5/10>> Lt knee 5/11>> Blood 5/11>>  Antibiotics: Doxycycline 5/10>>5/11 Zosyn 5/10>> Vancomycin 5/10>>  Tests/events 03/13/11 CT abd/pelvis>>8 mm RML nodule 11/17/11 CT chest>>borderline hilar/mediastinal adenopathy, b/l rounded nodules of varying size up to 2.6 cm 11/17/11 CT abd/pelvis>>abnormal soft tissue surrounding bifurcation of abdominal aorta 11/21/11 Scrotal u/s>>no evidence of testicular masses 11/24/11 PET scan>>numerous b/l pulmonary nodules with SUV up to 12.1, SUV 6.2 in abnormal soft tissue around abdominal aorta, SUV 6.1 Lt prostate   11/22/11 Echo>>mild LVH, EF 65 to 70%, grade 1 diastolic dysfx, mild AS, mild LA/RA dilation, PAS 42 mmHg  PHYICAL EXAM:  Blood pressure 135/69, pulse 85, temperature 97.7 F (36.5 C), temperature source Oral, resp. rate 20, height 6\' 2"  (1.88 m), weight 96.3 kg (212 lb 4.9 oz), SpO2 90.00%. Body mass index is 27.26 kg/(m^2).  I/O last 3 completed shifts: In: 3027 [P.O.:240; I.V.:1700; IV Piggyback:1087] Out: 1700 [Urine:1700]  Intake/Output Summary (Last 24 hours) at 11/26/11 0908 Last data filed at 11/26/11 0826  Gross per 24 hour  Intake 2201.17 ml  Output    875 ml  Net 1326.17 ml   Vent Mode:  [-]  FiO2 (%):  [40 %] 40 %  Physical exam: General - Increased WOB, has to stop to catch breath between words HEENT - PERRLA, EOMI, no sinus tenderness, clear nasal discharge, shallow ulcer Rt upper gum and Lt lateral tongue, no LAN, no thyromegaly Chest -  scattered rhonchi, no wheeze, diminished respiratory excursion Cardiac - s1s2 regular, no murmur Abd - soft, mild distention, +bowel sounds, no organomegaly Ext - mild swelling in knees b/l Lt > Rt, full range of motion Neuro - A&O x 3, CN intact, normal strength Psych - normal mood, behavior Skin - no rashes  Lab Results  Component Value Date   WBC 22.3* 11/26/2011   HGB 10.9* 11/26/2011   HCT 32.8* 11/26/2011   MCV 89.6 11/26/2011   PLT 596* 11/26/2011  ,  Lab Results  Component Value Date   CREATININE 0.80 11/26/2011   BUN 26* 11/26/2011   NA 138 11/26/2011   K 3.4* 11/26/2011   CL 100 11/26/2011   CO2 29 11/26/2011  ,  Lab Results  Component Value Date   ALT 22 11/25/2011   AST 26 11/25/2011   ALKPHOS 277* 11/25/2011   BILITOT 0.5 11/25/2011  ,  Lab Results  Component Value Date   CKTOTAL 44 11/22/2011   CKMB 2.0 11/22/2011   TROPONINI <0.30 11/22/2011   ,  Lab Results  Component Value Date   ESRSEDRATE 69* 11/21/2011  ,  Lab Results  Component Value Date   TSH 6.679* 11/21/2011    ABG    Component Value Date/Time   PHART 7.459* 11/24/2011 2010   HCO3 30.2* 11/24/2011 2010   TCO2 27.1 11/24/2011 2010   O2SAT 95.1 11/24/2011 2010     Dg Chest 1 View  11/24/2011  *RADIOLOGY REPORT*  Clinical Data: Cough, shortness of breath, pneumonia.  CHEST - 1 VIEW  Comparison: 11/21/2011  Findings: Patchy areas of consolidation throughout  both lungs, most pronounced and progressed in the upper lobes.  Small bilateral pleural effusions.  Low lung volumes.  Heart is upper limits normal in size.  IMPRESSION: Worsening patchy bilateral airspace opacities most compatible with multifocal pneumonia.  Original Report Authenticated By: Cyndie Chime, M.D.   Nm Pet Image Initial (pi) Skull Base To Thigh  11/24/2011  *RADIOLOGY REPORT*  Clinical Data: Initial treatment strategy for lung metastases, unknown primary.  NUCLEAR MEDICINE PET SKULL BASE TO THIGH  Fasting Blood Glucose:  128  Technique:   15.8 mCi F-18 FDG was injected intravenously. CT data was obtained and used for attenuation correction and anatomic localization only.  (This was not acquired as a diagnostic CT examination.) Additional exam technical data entered on technologist worksheet.  Comparison:  CT chest dated 11/17/2011.  CT abdomen pelvis dated 11/18/2011.  Findings:  Neck: No hypermetabolic lymph nodes in the neck.  Chest:  Numerous bilateral pulmonary nodules/opacities, including: --Right apical nodule with max SUV 8.6 (PET image 61) --Left apical nodule with max SUV 7.2 (PET image 63) --Anterior right upper lobe nodule with max SUV 8.4 (PET image 74) --Right basilar nodular opacities with max SUV 13.4 (PET image 96) --Left basilar nodular opacities with max SUV 12.1 (PET image 96)  These findings are worrisome for primary or metastatic malignancy, possibly with superimposed infection at the lung bases.  Abdomen/Pelvis:  Abnormal soft tissue surrounding the lower abdominal aorta and left common iliac artery, as noted on recent CT, with max SUV 6.2 (PET image 189).  No abnormal hypermetabolic activity within the liver, pancreas, adrenal glands, or spleen.  Hypermetabolic focus in the left prostate gland, max SUV 6.1 (PET image 236).  7 mm distal left ureteral calculus.  [place]:  No focal hypermetabolic activity to suggest skeletal metastasis.  IMPRESSION: Multiple bilateral pulmonary nodules, max SUV 8.6, worrisome for primary or metastatic malignancy.  Correlate with scheduled biopsy.  Nodular opacities at the bilateral lung bases, max SUV 13.4, worrisome for malignancy although superimposed infection is not excluded.  No abnormal soft tissues surrounding the lower abdominal aorta and left common iliac artery, max SUV 6.2, worrisome for malignancy such as lymphoma or nodal metastases.  Hypermetabolic focus in the left prostate gland, max SUV 6.1, suspicious for primary prostatic adenocarcinoma.  Of note, this is considered unrelated to  the above findings.  Original Report Authenticated By: Charline Bills, M.D.   Dg Chest Port 1 View  11/26/2011  *RADIOLOGY REPORT*  Clinical Data: Respiratory failure  PORTABLE CHEST - 1 VIEW  Comparison: Multiple recent previous exams.  Findings: 0459 hours.  The patchy asymmetric airspace disease persist bilaterally.  There is been no substantial interval change. Underlying chronic interstitial opacity is evident.  The heart size is stable. Telemetry leads overlie the chest.  IMPRESSION: No substantial interval change.  Bilateral patchy alveolar opacity superimposed on chronic underlying interstitial changes.  Original Report Authenticated By: ERIC A. MANSELL, M.D.   Dg Chest Port 1 View  11/25/2011  *RADIOLOGY REPORT*  Clinical Data: Infiltrates.  Follow-up.  PORTABLE CHEST - 1 VIEW  Comparison: 11/24/2011.  Findings: Low volume chest.  Multifocal patchy airspace disease appears little changed compared to prior.  Cardiopericardial silhouette and mediastinal contours are unchanged.  IMPRESSION: Unchanged multi focal airspace disease.  Low volume chest.  Original Report Authenticated By: Andreas Newport, M.D.   Dg Chest Port 1 View  11/24/2011  *RADIOLOGY REPORT*  Clinical Data: Shortness of breath.  Fever.  Respiratory failure.  PORTABLE CHEST -  1 VIEW  Comparison: Prior exam from 11/24/2010  Findings: Bilateral relatively indistinct lung masses/opacities are again noted.  Blunting of both costophrenic angles noted.  There is increasing interstitial accentuation in the lungs favoring interstitial edema.  Thoracic spondylosis noted.  IMPRESSION:  1.  In addition to the scattered bilateral rounded pulmonary opacities, there is interstitial accentuation in the lungs favoring interstitial edema.  The opacities themselves were hypermetabolic on today's PET CT and could represent rapidly progressive metastatic disease or granulomatous infection.  2.  Small bilateral pleural effusions.  Original Report  Authenticated By: Dellia Cloud, M.D.   ASSESSMENT/PLAN:   Pulmonary:  Acute hypoxic respiratory failure with progressive b/l pulmonary infiltrates with more peripheral distribution.  This has been associated with symmetric b/l lower extremity polyarthralgia.  He required prednisone therapy to treat sinus infection in Fall 2012.  I am more concerned about inflammatory process (more likely) vs infectious process.  Malignancy seems less likely given that he had a normal chest xray in April 2013, and has since developed rapidly progressive airspace disease.  He has normal renal fx, and benign U/A.  His ESR was 69 on admission.  CPK, uric acid, LDH normal.  He had mild elevation in TSH (6.679).  ANA, ACE, RF, Hep A/B/C, Cryptococcal Ag all negative.  B burgdorferi Ab IgG + IgM 1.41.  Histoplasma Ab, RMSF IgM/IgG pending.  Plan: - Hold in ICU given BiPAP need overnight. - No IR lung biopsy. - D/W ID>>continue current ABx and okay to start steroids, patient is thinking about bronch but would like to speak with family first and discuss plan of care. - Continue solumedrol 60 mg IV q6hrs. - ANCA pending, ANA and ACE negative, RF 18 (nl 14), RMSF negative IgG and IgM. - Suggested bronch for culture, but unfortunately O2 demand is so high that we would have to intubate for bronch.  Patient did not want that.  He understands the risks taken by not doing bronch and is agreeable to sputum induction. - After discussion on need for biopsy vs conservative measure, all questions were answered and family is discussing options.  They are fully aware that conservative approach of steroids then wait and see does not answer the question of cancer however unlikely it is. - Attempt to get down to 50% mask, will keep BiPAP for work of breathing and Sat of 88-92% is perfectly acceptable. - F/u CXR in AM. - After discussion with patient and son, will allow til tomorrow, if no improvement by AM in O2 demand will  consult CVTS for diagnosis. - Lasix 5/15.  Cardiac:  Hypertension>>no hx of this as outpt.   - Monitor BP and add tx as needed specially with steroid use. - 2D echo EF 65%, grade I diastolic dysfunction and PAP of 42. - Two doses of lasix and replace K.  Renal: Ureteral stone  - Seen by urology - F/u with urology as outpt  Musculoskeletal:  Joint pain - Continue pain meds - Should improve with steroids - F/u culture results from knee tap  Gastro-intestinal:  Constipation, Mouth ulcers, abnormal soft tissue swelling around abdominal aorta - Start diet today, will not bronch today. - Continue bowel regimen. - Monitor oral exam. - May need additional biopsy of soft tissue mass around abdominal aorta or at least re-image.  Endocrine: Steroid induced hyperglycemia - SSI  Mild elevation in TSH - Will need further assessment as outpt.  Best Practice/disposition: -Transfer to ICU under PCCM service. -Heart healthy diet. -Lovenox  for DVT prophylaxis. -Protonix for SUP.  Updated family and patient at bedside.  Critical care time 45 minutes.  Denene Alamillo 11/26/2011, 9:08 AM Pager:  [phone]

## 2011-11-27 ENCOUNTER — Inpatient Hospital Stay (HOSPITAL_COMMUNITY): Payer: Medicare Other

## 2011-11-27 DIAGNOSIS — J8410 Pulmonary fibrosis, unspecified: Secondary | ICD-10-CM

## 2011-11-27 LAB — HISTOPLASMA ANTIBODIES
Histoplasma Ab Yeast CF: <1:8 {titer}
Mycelia phase ab: <1:8 {titer}

## 2011-11-27 LAB — CULTURE, RESPIRATORY W GRAM STAIN

## 2011-11-27 LAB — GLUCOSE, CAPILLARY
Glucose-Capillary: 159 mg/dL — ABNORMAL HIGH (ref 70–99)
Glucose-Capillary: 152 mg/dL — ABNORMAL HIGH (ref 70–99)

## 2011-11-27 LAB — CULTURE, BLOOD (ROUTINE X 2)
Culture  Setup Time: 201305101323
Culture: NO GROWTH

## 2011-11-27 LAB — PHOSPHORUS: Phosphorus: 3.4 mg/dL (ref 2.3–4.6)

## 2011-11-27 LAB — CBC
HCT: 33.8 % — ABNORMAL LOW (ref 39.0–52.0)
MCHC: 33.1 g/dL (ref 30.0–36.0)
Platelets: 720 10*3/uL — ABNORMAL HIGH (ref 150–400)
RDW: 14.8 % (ref 11.5–15.5)

## 2011-11-27 LAB — BASIC METABOLIC PANEL
BUN: 26 mg/dL — ABNORMAL HIGH (ref 6–23)
GFR calc Af Amer: >90 mL/min (ref >90)
GFR calc non Af Amer: 85 mL/min — ABNORMAL LOW (ref >90)
Potassium: 4 mEq/L (ref 3.5–5.1)

## 2011-11-27 MED ORDER — PANTOPRAZOLE SODIUM 40 MG PO TBEC
40.0000 mg | DELAYED_RELEASE_TABLET | Freq: Every day | ORAL | Status: DC
  Administered 2011-11-27: 40 mg via ORAL
  Filled 2011-11-27: qty 1

## 2011-11-27 MED ORDER — POTASSIUM CHLORIDE CRYS ER 20 MEQ PO TBCR
40.0000 meq | EXTENDED_RELEASE_TABLET | Freq: Three times a day (TID) | ORAL | Status: AC
  Administered 2011-11-27 (×3): 40 meq via ORAL
  Filled 2011-11-27 (×3): qty 2

## 2011-11-27 MED ORDER — FUROSEMIDE 10 MG/ML IJ SOLN
20.0000 mg | Freq: Four times a day (QID) | INTRAMUSCULAR | Status: AC
  Administered 2011-11-27 (×3): 20 mg via INTRAVENOUS
  Filled 2011-11-27 (×3): qty 2

## 2011-11-27 MED ORDER — POTASSIUM CHLORIDE 20 MEQ/15ML (10%) PO LIQD: 40.0000 meq | Freq: Three times a day (TID) | ORAL | Status: DC

## 2011-11-27 NOTE — Progress Notes (Signed)
Pt being transferred to [facility] via [facility]. VS stable pt resting at this time. Wife is with the pt in the room and has his belongings.

## 2011-11-27 NOTE — H&P (Signed)
Report called to Courtney,RN to unit 2100.

## 2011-11-27 NOTE — Progress Notes (Addendum)
Referring physician: Richarda Overlie Reason for consult: Pulmonary nodules, dyspnea        PCCM Progress Note  HISTORY of PRESENT ILLNESS:  Robert Mcgee is a 71 y.o. male admitted on 11/21/2011 with fever, polyarthralgia, and found to have multiple pulmonary nodules.  PCCM consulted 5/13 to assess progressive b/l pulmonary infiltrates, dyspnea, and hypoxia.  PET/CT with diffuse uptake.  Started on high dose steroids and abx upon admission.  Cultures: Blood 5/10>> Urine 5/10>> Lt knee 5/11>> Blood 5/11>>  Antibiotics: Doxycycline 5/10>>5/11 Zosyn 5/10>> Vancomycin 5/10>>  Tests/events 03/13/11 CT abd/pelvis>>8 mm RML nodule 11/17/11 CT chest>>borderline hilar/mediastinal adenopathy, b/l rounded nodules of varying size up to 2.6 cm 11/17/11 CT abd/pelvis>>abnormal soft tissue surrounding bifurcation of abdominal aorta 11/21/11 Scrotal u/s>>no evidence of testicular masses 11/24/11 PET scan>>numerous b/l pulmonary nodules with SUV up to 12.1, SUV 6.2 in abnormal soft tissue around abdominal aorta, SUV 6.1 Lt prostate   11/22/11 Echo>>mild LVH, EF 65 to 70%, grade 1 diastolic dysfx, mild AS, mild LA/RA dilation, PAS 42 mmHg  PHYICAL EXAM:  Blood pressure 123/65, pulse 61, temperature 97.5 F (36.4 C), temperature source Oral, resp. rate 21, height 6\' 2"  (1.88 m), weight 94.9 kg (209 lb 3.5 oz), SpO2 92.00%. Body mass index is 26.86 kg/(m^2).  I/O last 3 completed shifts: In: 2221.2 [P.O.:390; I.V.:671.7; IV Piggyback:1159.5] Out: 4345 [Urine:4345]  Intake/Output Summary (Last 24 hours) at 11/27/11 0809 Last data filed at 11/27/11 0600  Gross per 24 hour  Intake 1091.67 ml  Output   3720 ml  Net -2628.33 ml   Vent Mode:  [-]  FiO2 (%):  [40 %-50 %] 50 %  Physical exam: General - Increased WOB, has to stop to catch breath between words HEENT - PERRLA, EOMI, no sinus tenderness, clear nasal discharge, shallow ulcer Rt upper gum and Lt lateral tongue, no LAN, no  thyromegaly Chest - scattered rhonchi, no wheeze, diminished respiratory excursion Cardiac - s1s2 regular, no murmur Abd - soft, mild distention, +bowel sounds, no organomegaly Ext - mild swelling in knees b/l Lt > Rt, full range of motion Neuro - A&O x 3, CN intact, normal strength Psych - normal mood, behavior Skin - no rashes  Lab Results  Component Value Date   WBC 19.7* 11/27/2011   HGB 11.2* 11/27/2011   HCT 33.8* 11/27/2011   MCV 88.7 11/27/2011   PLT 720* 11/27/2011  ,  Lab Results  Component Value Date   CREATININE 0.87 11/27/2011   BUN 26* 11/27/2011   NA 135 11/27/2011   K 4.0 11/27/2011   CL 98 11/27/2011   CO2 27 11/27/2011  ,  Lab Results  Component Value Date   ALT 22 11/25/2011   AST 26 11/25/2011   ALKPHOS 277* 11/25/2011   BILITOT 0.5 11/25/2011  ,  Lab Results  Component Value Date   CKTOTAL 44 11/22/2011   CKMB 2.0 11/22/2011   TROPONINI <0.30 11/22/2011   ,  Lab Results  Component Value Date   ESRSEDRATE 69* 11/21/2011  ,  Lab Results  Component Value Date   TSH 6.679* 11/21/2011    ABG    Component Value Date/Time   PHART 7.459* 11/24/2011 2010   HCO3 30.2* 11/24/2011 2010   TCO2 27.1 11/24/2011 2010   O2SAT 95.1 11/24/2011 2010     Dg Chest Port 1 View  11/27/2011  *RADIOLOGY REPORT*  Clinical Data: Hypoxia, respiratory failure.  PORTABLE CHEST - 1 VIEW  Comparison: 11/26/2011  Findings: Patchy bilateral airspace disease again  noted, unchanged. Heart is normal size.  Trace right pleural effusion.  IMPRESSION: No significant change patchy bilateral airspace disease.  Original Report Authenticated By: Cyndie Chime, M.D.   Dg Chest Port 1 View  11/26/2011  *RADIOLOGY REPORT*  Clinical Data: Respiratory failure  PORTABLE CHEST - 1 VIEW  Comparison: Multiple recent previous exams.  Findings: 0459 hours.  The patchy asymmetric airspace disease persist bilaterally.  There is been no substantial interval change. Underlying chronic interstitial opacity is  evident.  The heart size is stable. Telemetry leads overlie the chest.  IMPRESSION: No substantial interval change.  Bilateral patchy alveolar opacity superimposed on chronic underlying interstitial changes.  Original Report Authenticated By: ERIC A. MANSELL, M.D.   ASSESSMENT/PLAN:   Pulmonary:  Acute hypoxic respiratory failure with progressive b/l pulmonary infiltrates with more peripheral distribution.  This has been associated with symmetric b/l lower extremity polyarthralgia.  He required prednisone therapy to treat sinus infection in Fall 2012.  I am more concerned about inflammatory process (more likely) vs infectious process.  Malignancy seems less likely given that he had a normal chest xray in April 2013, and has since developed rapidly progressive airspace disease.  He has normal renal fx, and benign U/A.  His ESR was 69 on admission.  CPK, uric acid, LDH normal.  He had mild elevation in TSH (6.679).  ANA, ACE, RF, Hep A/B/C, Cryptococcal Ag all negative.  B burgdorferi Ab IgG + IgM 1.41.  Histoplasma Ab, RMSF IgM/IgG pending.  The patient has had >48 hours of steroids but O2 demand unfortunately has not improved dramatically as originally hoped.  He also failed to respond to diureses.  At this point will call CVTS for open lung biopsy and bypass bronchoscopy altogether as I doubt that will render VATS unnecessary.   Plan: - Hold in ICU given BiPAP need overnight. - No IR lung biopsy. - D/W ID>>continue current ABx and okay to start steroids, patient is thinking about bronch but would like to speak with family first and discuss plan of care. - Continue solumedrol 60 mg IV q6hrs. - ANCA pending, ANA and ACE negative, RF 18 (nl 14), RMSF negative IgG and IgM. - Suggested bronch for culture, but unfortunately O2 demand is so high that we would have to intubate for bronch.  Patient did not want that.  He understands the risks taken by not doing bronch and is agreeable to sputum  induction. - Recommend VATS at this point and will CVTS for consultation. - Attempt to get down to 50% mask, will keep BiPAP for work of breathing and Sat of 88-92% is perfectly acceptable. - F/u CXR in AM. - After discussion with patient and son, will allow til tomorrow, if no improvement by AM in O2 demand will consult CVTS for diagnosis. - Additional diureses.  Cardiac:  Hypertension>>no hx of this as outpt.   - Monitor BP and add tx as needed specially with steroid use. - 2D echo EF 65%, grade I diastolic dysfunction and PAP of 42. - Additional lasix.  Renal: Ureteral stone  - Seen by urology. - F/u with urology as outpt.  Musculoskeletal:  Joint pain - Continue pain meds - Should improve with steroids - F/u culture results from knee tap  Gastro-intestinal:  Constipation, Mouth ulcers, abnormal soft tissue swelling around abdominal aorta - Continue diet and will likely need to be NPO after midnight for CVTS but will call consult first. - Continue bowel regimen. - Monitor oral exam. - May need  additional biopsy of soft tissue mass around abdominal aorta or at least re-image but will get lung biopsy first.  Endocrine: Steroid induced hyperglycemia - SSI  Mild elevation in TSH - Will need further assessment as outpt.  Best Practice/disposition: -Transfer to ICU under PCCM service. -Heart healthy diet. -Lovenox for DVT prophylaxis. -Protonix for SUP.  Extensive discussion with the family, options given, no other way around VATS biopsy at this point unfortunately, will call CVTS, family and patient ok with that and are aware that patient will likely need to be on vent for a couple of days post procedure.  Critical care time 45 minutes.  Robert Mcgee 11/27/2011, 8:09 AM Pager:  [phone]

## 2011-11-27 NOTE — Progress Notes (Signed)
CARE MANAGEMENT NOTE 11/27/2011  Patient:  CRISTEN, MURCIA   Account Number:  [id number]  Date Initiated:  11/24/2011  Documentation initiated by:  [name]  Subjective/Objective Assessment:   ADMITTED WITH GENERALIZED WEAKNESS     Action/Plan:   LIVES AT HOME WITH SPOUSE; INSEPENDENT PRIOR TO ADMISSION   Anticipated DC Date:  11/30/2011   Anticipated DC Plan:  HOME W HOME HEALTH SERVICES         Choice offered to / List presented to:             Status of service:  In process, will continue to follow Medicare Important Message given?   (If response is "NO", the following Medicare IM given date fields will be blank) Date Medicare IM given:   Date Additional Medicare IM given:    Discharge Disposition:    Per UR Regulation:  Reviewed for med. necessity/level of care/duration of stay  If discussed at Long Length of Stay Meetings, dates discussed:    Comments:  [id number] [name], RN, BSN, CCM patient still requiring bipap at night and 5l o2 to keep o2 above 92% No discharge needs present at time of this review at the sdu/icu level. Case Management [phone]  11/24/2011- [name] RN, BSN, MHA

## 2011-11-27 NOTE — Progress Notes (Signed)
The patient is receiving Protonix by the intravenous route. Based on criteria approved by the Pharmacy and Therapeutics Committee and the Medical Executive Committee, the medication is being converted to the equivalent oral dose form.   These criteria include:  -No Active GI bleeding  -Able to tolerate diet of full liquids (or better) or tube feeding  -Able to tolerate other medications by the oral or enteral route   If you have any questions about this conversion, please contact the Pharmacy Department ([phone] Thank you.  Robert Mcgee, PharmD, BCPS     

## 2011-11-27 NOTE — Consult Note (Signed)
[address]            [place] [address]          [phone]       Robert Mcgee [facility] Medical Record #[id number] Date of Birth: 19-Aug-1940  Referring: No ref. provider found Primary Care: Daisy Floro, MD, MD  Chief Complaint:    Chief Complaint  Patient presents with  . Pain  shortness of breath  History of Present Illness:     71Y/O ex smoker with acute respiratory failure and rapidly progressive bilat pulmonary infitrates not responding to iv steroids and antibiotics.Oxygenation getting worse. Openm lung biopsy requested by CCM  Current Activity/ Functional Status: Normal before end of April   Past Medical History  Diagnosis Date  . Kidney stone   . Pneumothorax   . Lung nodules   had R chest tube for traumatic PNTX 1995  Past Surgical History  Procedure Date  . Varicose vein surgery     History  Smoking status  . Former Smoker  . Quit date: 11/20/1980  Smokeless tobacco  . Never Used    History  Alcohol Use No    History   Social History  . Marital Status: Married    Spouse Name: N/A    Number of Children: N/A  . Years of Education: N/A   Occupational History  . Not on file.   Social History Main Topics  . Smoking status: Former Smoker    Quit date: 11/20/1980  . Smokeless tobacco: Never Used  . Alcohol Use: No  . Drug Use: No  . Sexually Active: No   Other Topics Concern  . Not on file   Social History Narrative  . No narrative on file    No Known Allergies  Current Facility-Administered Medications  Medication Dose Route Frequency Provider Last Rate Last Dose  . acetaminophen (TYLENOL) tablet 650 mg  650 mg Oral Q6H PRN Richarda Overlie, MD   650 mg at 11/24/11 1815  . ipratropium (ATROVENT) nebulizer solution 0.5 mg  0.5 mg Nebulization Q2H PRN Coralyn Helling, MD       And  . albuterol (PROVENTIL) (5 MG/ML) 0.5% nebulizer solution 2.5 mg  2.5 mg Nebulization Q2H PRN Coralyn Helling, MD        . antiseptic oral rinse (BIOTENE) solution 15 mL  15 mL Mouth Rinse q12n4p Kalman Shan, MD   15 mL at 11/27/11 1600  . chlorhexidine (PERIDEX) 0.12 % solution 15 mL  15 mL Mouth Rinse BID Kalman Shan, MD   15 mL at 11/27/11 2008  . docusate sodium (COLACE) capsule 100 mg  100 mg Oral BID Richarda Overlie, MD   100 mg at 11/25/11 2200  . enoxaparin (LOVENOX) injection 40 mg  40 mg Subcutaneous Q24H Richarda Overlie, MD   40 mg at 11/27/11 1758  . feeding supplement (ENSURE COMPLETE) liquid 237 mL  237 mL Oral BID BM Richarda Overlie, MD   237 mL at 11/27/11 1527  . fentaNYL (SUBLIMAZE) injection 25-50 mcg  25-50 mcg Intravenous Q1H PRN Storm Frisk, MD   50 mcg at 11/27/11 2005  . furosemide (LASIX) injection 20 mg  20 mg Intravenous Q6H Alyson Reedy, MD   20 mg at 11/27/11 1624  . insulin aspart (novoLOG) injection 0-20 Units  0-20 Units Subcutaneous Q4H Coralyn Helling, MD   7 Units at 11/27/11 2005  .  methylPREDNISolone sodium succinate (SOLU-MEDROL) 125 mg/2 mL injection 60 mg  60 mg Intravenous Q6H Coralyn Helling, MD   60 mg at 11/27/11 1758  . ondansetron (ZOFRAN) injection 4 mg  4 mg Intravenous Q6H PRN Richarda Overlie, MD      . pantoprazole (PROTONIX) EC tablet 40 mg  40 mg Oral Q1200 Rollene Fare, PHARMD   40 mg at 11/27/11 1231  . piperacillin-tazobactam (ZOSYN) IVPB 3.375 g  3.375 g Intravenous Q8H Richarda Overlie, MD   3.375 g at 11/27/11 1526  . polyethylene glycol (MIRALAX / GLYCOLAX) packet 17 g  17 g Oral Daily Richarda Overlie, MD   17 g at 11/25/11 0950  . potassium chloride 20 MEQ/15ML (10%) liquid 40 mEq  40 mEq Per Tube TID Alyson Reedy, MD   40 mEq at 11/26/11 2200  . potassium chloride SA (K-DUR,KLOR-CON) CR tablet 40 mEq  40 mEq Oral TID Alyson Reedy, MD   40 mEq at 11/27/11 1624  . senna (SENOKOT) tablet 8.6 mg  1 tablet Oral BID Richarda Overlie, MD   8.6 mg at 11/25/11 2200  . Tamsulosin HCl (FLOMAX) capsule 0.4 mg  0.4 mg Oral Daily Richarda Overlie, MD   0.4 mg at 11/27/11 0953  .  vancomycin (VANCOCIN) IVPB 1000 mg/200 mL premix  1,000 mg Intravenous Q8H Christine E Shade, PHARMD   1,000 mg at 11/27/11 1624  . DISCONTD: fentaNYL (SUBLIMAZE) injection 25 mcg  25 mcg Intravenous Q2H PRN Coralyn Helling, MD   25 mcg at 11/26/11 2030  . DISCONTD: pantoprazole (PROTONIX) injection 40 mg  40 mg Intravenous Q24H Coralyn Helling, MD   40 mg at 11/26/11 2200  . DISCONTD: potassium chloride 20 MEQ/15ML (10%) liquid 40 mEq  40 mEq Per Tube TID Alyson Reedy, MD        Prescriptions prior to admission  Medication Sig Dispense Refill  . acetaminophen (TYLENOL) 500 MG tablet Take 500 mg by mouth every 6 (six) hours as needed. Pain      . B Complex-C (B-COMPLEX WITH VITAMIN C) tablet Take 1 tablet by mouth daily.      Marland Kitchen HYDROcodone-acetaminophen (VICODIN) 5-500 MG per tablet Take 1 tablet by mouth every 6 (six) hours as needed. Pain      . ibuprofen (ADVIL,MOTRIN) 200 MG tablet Take 200 mg by mouth every 6 (six) hours as needed. pain      . levofloxacin (LEVAQUIN) 500 MG tablet Take 500 mg by mouth daily. Pt started on Monday for 10 day supply. pts on day 4      . Multiple Vitamins-Minerals (MULTIVITAMIN WITH MINERALS) tablet Take 1 tablet by mouth daily.      . Omega-3 Fatty Acids (FISH OIL) 1200 MG CAPS Take 1,200 mg by mouth daily.        History reviewed. No pertinent family history.   Review of Systems:     Cardiac Review of Systems: Y or N  Chest Pain [ y   ]  Resting SOB [ y  ] Exertional SOB  y[  ]  Myer Peer  ]   Pedal Edema [   ]    Palpitations [  ] Syncope  [  ]   Presyncope [   ]  General Review of Systems: [Y] = yes [  ]=no Constitional: recent weight change [ n ]; anorexia [  ]; fatigue [  ]; nausea [  ]; night sweats [ n ]; fever [n  ]; or chills [  n ];                                                                                                                                          Dental: poor dentition[  ]; Last Dentist visit:1 yr  Eye : blurred vision [  ];  diplopia [   ]; vision changes [  ];  Amaurosis fugax[  ]; Resp: cough [  ];  wheezing[  ];  hemoptysis[  ]; shortness of breath[y  ]; paroxysmal nocturnal dyspnea[  ]; dyspnea on exertion[  ]; or orthopnea[  ];  GI:  gallstones[  ], vomiting[  ];  dysphagia[  ]; melena[  ];  hematochezia [  ]; heartburn[  ];   Hx of  Colonoscopy[  ]; GU: kidney stones Cove.Etienne  ]; hematuria[  ];   dysuria [  ];  nocturia[  ];  history of     obstruction [  ];             Skin: rash, swelling[  ];, hair loss[  ];  peripheral edema[  ];  or itching[  ]; Musculosketetal: myalgias[y  ];  joint swelling[  ];  joint erythema[  ];  joint pain[ y ];  back pain[  ];  Heme/Lymph: bruising[  ];  bleeding[  ];  anemia[  ];  Neuro: TIA[  ];  headaches[  ];  stroke[  ];  vertigo[  ];  seizures[  ];   paresthesias[  ];  difficulty walking[  ];  Psych:depression[  ]; anxiety[  ];  Endocrine: diabetes[  ];  thyroid dysfunction[  ];  Immunizations: Flu [  ]; Pneumococcal[  ];  Other:  Physical Exam: BP 133/57  Pulse 80  Temp(Src) 98.4 F (36.9 C) (Oral)  Resp 31  Ht 6\' 2"  (1.88 m)  Wt 209 lb 3.5 oz (94.9 kg)  BMI 26.86 kg/m2  SpO2 90%  Normocephalic No adenopathy Dry rales No cardiac murmur Abd soft Normal pulses No focal neuro deficit   Diagnostic Studies & Laboratory data:     Recent Radiology Findings:   Dg Chest Port 1 View  11/27/2011  *RADIOLOGY REPORT*  Clinical Data: Hypoxia, respiratory failure.  PORTABLE CHEST - 1 VIEW  Comparison: 11/26/2011  Findings: Patchy bilateral airspace disease again noted, unchanged. Heart is normal size.  Trace right pleural effusion.  IMPRESSION: No significant change patchy bilateral airspace disease.  Original Report Authenticated By: Cyndie Chime, M.D.   Dg Chest Port 1 View  11/26/2011  *RADIOLOGY REPORT*  Clinical Data: Respiratory failure  PORTABLE CHEST - 1 VIEW  Comparison: Multiple recent previous exams.  Findings: 0459 hours.  The patchy asymmetric airspace disease  persist bilaterally.  There is been no substantial interval change. Underlying chronic interstitial opacity is evident.  The heart size is stable. Telemetry leads overlie the chest.  IMPRESSION: No substantial interval change.  Bilateral patchy alveolar opacity superimposed on chronic underlying interstitial changes.  Original Report Authenticated By: ERIC A. MANSELL, M.D.      Recent Lab Findings: Lab Results  Component Value Date   WBC 19.7* 11/27/2011   HGB 11.2* 11/27/2011   HCT 33.8* 11/27/2011   PLT 720* 11/27/2011   GLUCOSE 175* 11/27/2011   ALT 22 11/25/2011   AST 26 11/25/2011   NA 135 11/27/2011   K 4.0 11/27/2011   CL 98 11/27/2011   CREATININE 0.87 11/27/2011   BUN 26* 11/27/2011   CO2 27 11/27/2011   TSH 6.679* 11/21/2011   INR 1.18 11/21/2011      Assessment / Plan:      Open L lung bx set for tomorrow at [facility]. Procedeure d/w patient and wife.Will need to transfer to [facility] tonight and anticipate popst op vent dependence      @me1 @ 11/27/2011 8:58 PM

## 2011-11-28 ENCOUNTER — Inpatient Hospital Stay (HOSPITAL_COMMUNITY): Payer: Medicare Other

## 2011-11-28 ENCOUNTER — Encounter (HOSPITAL_COMMUNITY)
Admission: EM | Disposition: A | Discharge status: Home / Self Care | Payer: Self-pay | Source: Home / Self Care | Attending: Pulmonary Disease

## 2011-11-28 ENCOUNTER — Encounter (HOSPITAL_COMMUNITY): Payer: Self-pay | Admitting: Anesthesiology

## 2011-11-28 ENCOUNTER — Inpatient Hospital Stay (HOSPITAL_COMMUNITY): Payer: Medicare Other | Admitting: Anesthesiology

## 2011-11-28 DIAGNOSIS — J8410 Pulmonary fibrosis, unspecified: Secondary | ICD-10-CM

## 2011-11-28 HISTORY — PX: VIDEO ASSISTED THORACOSCOPY: SHX5073

## 2011-11-28 HISTORY — PX: LUNG BIOPSY: SHX5088

## 2011-11-28 LAB — ANCA SCREEN W REFLEX TITER
c-ANCA Screen: POSITIVE — AB
p-ANCA Screen: NEGATIVE

## 2011-11-28 LAB — POCT I-STAT 3, ART BLOOD GAS (G3+)
Patient temperature: 97.2
TCO2: 29 mmol/L (ref 0–100)
pCO2 arterial: 44.9 mmHg (ref 35.0–45.0)
pH, Arterial: 7.399 (ref 7.350–7.450)

## 2011-11-28 LAB — URINALYSIS, ROUTINE W REFLEX MICROSCOPIC
Bilirubin Urine: NEGATIVE
Glucose, UA: NEGATIVE mg/dL
Ketones, ur: NEGATIVE mg/dL
Leukocytes, UA: NEGATIVE
Nitrite: NEGATIVE
Protein, ur: NEGATIVE mg/dL
Specific Gravity, Urine: 1.016 (ref 1.005–1.030)
Urobilinogen, UA: 0.2 mg/dL (ref 0.0–1.0)
pH: 8 (ref 5.0–8.0)

## 2011-11-28 LAB — PROTIME-INR
INR: 1.12 (ref 0.00–1.49)
Prothrombin Time: 14.6 seconds (ref 11.6–15.2)

## 2011-11-28 LAB — GLUCOSE, CAPILLARY
Glucose-Capillary: 202 mg/dL — ABNORMAL HIGH (ref 70–99)
Glucose-Capillary: 215 mg/dL — ABNORMAL HIGH (ref 70–99)

## 2011-11-28 LAB — URINE MICROSCOPIC-ADD ON

## 2011-11-28 SURGERY — VIDEO ASSISTED THORACOSCOPY
Anesthesia: General | Site: Chest | Laterality: Left | Wound class: Clean Contaminated

## 2011-11-28 MED ORDER — LIDOCAINE HCL (CARDIAC) 20 MG/ML IV SOLN
INTRAVENOUS | Status: DC | PRN
  Administered 2011-11-28: 60 mg via INTRAVENOUS

## 2011-11-28 MED ORDER — ACETAMINOPHEN 10 MG/ML IV SOLN
1000.0000 mg | Freq: Four times a day (QID) | INTRAVENOUS | Status: AC
  Administered 2011-11-28 – 2011-11-29 (×4): 1000 mg via INTRAVENOUS
  Filled 2011-11-28 (×4): qty 100

## 2011-11-28 MED ORDER — ENOXAPARIN SODIUM 40 MG/0.4ML ~~LOC~~ SOLN
40.0000 mg | SUBCUTANEOUS | Status: DC
  Administered 2011-11-29 – 2011-12-09 (×11): 40 mg via SUBCUTANEOUS
  Filled 2011-11-28 (×13): qty 0.4

## 2011-11-28 MED ORDER — PROPOFOL 10 MG/ML IV EMUL
INTRAVENOUS | Status: DC | PRN
  Administered 2011-11-28: 75 ug/kg/min via INTRAVENOUS

## 2011-11-28 MED ORDER — PHENYLEPHRINE HCL 10 MG/ML IJ SOLN
INTRAMUSCULAR | Status: DC | PRN
  Administered 2011-11-28 (×9): 80 ug via INTRAVENOUS

## 2011-11-28 MED ORDER — POTASSIUM CHLORIDE IN NACL 20-0.9 MEQ/L-% IV SOLN
INTRAVENOUS | Status: DC
  Administered 2011-11-28 – 2011-11-29 (×2): 1000 mL via INTRAVENOUS
  Administered 2011-11-29 – 2011-11-30 (×2): via INTRAVENOUS
  Filled 2011-11-28 (×8): qty 1000

## 2011-11-28 MED ORDER — POTASSIUM CHLORIDE 10 MEQ/50ML IV SOLN
10.0000 meq | Freq: Every day | INTRAVENOUS | Status: DC | PRN
  Filled 2011-11-28: qty 50

## 2011-11-28 MED ORDER — LACTATED RINGERS IV SOLN
INTRAVENOUS | Status: DC | PRN
  Administered 2011-11-28 (×2): via INTRAVENOUS

## 2011-11-28 MED ORDER — OXYCODONE-ACETAMINOPHEN 5-325 MG PO TABS
1.0000 | ORAL_TABLET | ORAL | Status: DC | PRN
  Administered 2011-11-30 – 2011-12-01 (×4): 2 via ORAL
  Administered 2011-12-02 – 2011-12-09 (×5): 1 via ORAL
  Filled 2011-11-28: qty 2
  Filled 2011-11-28 (×2): qty 1
  Filled 2011-11-28 (×2): qty 2
  Filled 2011-11-28: qty 1
  Filled 2011-11-28: qty 2
  Filled 2011-11-28: qty 1
  Filled 2011-11-28: qty 2
  Filled 2011-11-28: qty 1

## 2011-11-28 MED ORDER — PANTOPRAZOLE SODIUM 40 MG IV SOLR
40.0000 mg | Freq: Every day | INTRAVENOUS | Status: DC
  Administered 2011-11-28 – 2011-11-30 (×3): 40 mg via INTRAVENOUS
  Filled 2011-11-28 (×4): qty 40

## 2011-11-28 MED ORDER — OXYCODONE HCL 5 MG PO TABS
5.0000 mg | ORAL_TABLET | ORAL | Status: AC | PRN
  Administered 2011-11-29: 10 mg via ORAL
  Administered 2011-11-29: 5 mg via ORAL
  Filled 2011-11-28: qty 2
  Filled 2011-11-28: qty 1

## 2011-11-28 MED ORDER — MORPHINE SULFATE 2 MG/ML IJ SOLN
1.0000 mg | INTRAMUSCULAR | Status: DC | PRN
  Administered 2011-11-29 (×2): 1 mg via INTRAVENOUS
  Administered 2011-11-30 (×2): 2 mg via INTRAVENOUS
  Filled 2011-11-28 (×3): qty 1

## 2011-11-28 MED ORDER — INSULIN ASPART 100 UNIT/ML ~~LOC~~ SOLN
0.0000 [IU] | SUBCUTANEOUS | Status: DC
  Administered 2011-11-28: 8 [IU] via SUBCUTANEOUS
  Administered 2011-11-29: 4 [IU] via SUBCUTANEOUS
  Administered 2011-11-29: 8 [IU] via SUBCUTANEOUS
  Administered 2011-11-29: 2 [IU] via SUBCUTANEOUS
  Administered 2011-11-29: 8 [IU] via SUBCUTANEOUS
  Administered 2011-11-29: 2 [IU] via SUBCUTANEOUS
  Administered 2011-11-29: 4 [IU] via SUBCUTANEOUS
  Administered 2011-11-30: 12 [IU] via SUBCUTANEOUS
  Administered 2011-11-30: 2 [IU] via SUBCUTANEOUS
  Administered 2011-11-30 (×2): 4 [IU] via SUBCUTANEOUS
  Administered 2011-11-30: 8 [IU] via SUBCUTANEOUS
  Administered 2011-12-01 (×2): 2 [IU] via SUBCUTANEOUS
  Administered 2011-12-01: 4 [IU] via SUBCUTANEOUS
  Administered 2011-12-01: 8 [IU] via SUBCUTANEOUS
  Administered 2011-12-01: 2 [IU] via SUBCUTANEOUS

## 2011-11-28 MED ORDER — MIDAZOLAM HCL 2 MG/2ML IJ SOLN
1.0000 mg | INTRAMUSCULAR | Status: DC | PRN
  Administered 2011-11-28: 2 mg via INTRAVENOUS

## 2011-11-28 MED ORDER — BUPIVACAINE 0.5 % ON-Q PUMP SINGLE CATH 400 ML
INJECTION | Status: DC | PRN
  Administered 2011-11-28: 400 mL

## 2011-11-28 MED ORDER — DEXTROSE 5 % IV SOLN
1.5000 g | INTRAVENOUS | Status: DC | PRN
  Administered 2011-11-28: 1.5 g via INTRAVENOUS

## 2011-11-28 MED ORDER — BUPIVACAINE 0.5 % ON-Q PUMP SINGLE CATH 400 ML
400.0000 mL | INJECTION | Status: DC
  Filled 2011-11-28: qty 400

## 2011-11-28 MED ORDER — BUPIVACAINE ON-Q PAIN PUMP (FOR ORDER SET NO CHG)
INJECTION | Status: DC
  Filled 2011-11-28: qty 1

## 2011-11-28 MED ORDER — SODIUM CHLORIDE 0.9 % IV SOLN
INTRAVENOUS | Status: DC
  Administered 2011-11-28: 10 mL/h via INTRAVENOUS

## 2011-11-28 MED ORDER — FENTANYL BOLUS VIA INFUSION
50.0000 ug | Freq: Four times a day (QID) | INTRAVENOUS | Status: DC | PRN
  Filled 2011-11-28: qty 100

## 2011-11-28 MED ORDER — ROCURONIUM BROMIDE 100 MG/10ML IV SOLN
INTRAVENOUS | Status: DC | PRN
  Administered 2011-11-28 (×2): 50 mg via INTRAVENOUS
  Administered 2011-11-28: 30 mg via INTRAVENOUS
  Administered 2011-11-28: 20 mg via INTRAVENOUS

## 2011-11-28 MED ORDER — PROPOFOL 10 MG/ML IV EMUL
INTRAVENOUS | Status: DC | PRN
  Administered 2011-11-28: 130 mg via INTRAVENOUS

## 2011-11-28 MED ORDER — CEFUROXIME SODIUM 1.5 G IJ SOLR
1.5000 g | INTRAMUSCULAR | Status: DC
  Filled 2011-11-28: qty 1.5

## 2011-11-28 MED ORDER — TRAMADOL HCL 50 MG PO TABS
50.0000 mg | ORAL_TABLET | Freq: Four times a day (QID) | ORAL | Status: DC | PRN
  Filled 2011-11-28: qty 2

## 2011-11-28 MED ORDER — FENTANYL CITRATE 0.05 MG/ML IJ SOLN
50.0000 ug | INTRAMUSCULAR | Status: DC | PRN
  Administered 2011-11-28: 100 ug via INTRAVENOUS

## 2011-11-28 MED ORDER — PROPOFOL 10 MG/ML IV EMUL
5.0000 ug/kg/min | INTRAVENOUS | Status: DC
  Administered 2011-11-28: 50 ug/kg/min via INTRAVENOUS
  Administered 2011-11-29: 15 ug/kg/min via INTRAVENOUS
  Filled 2011-11-28 (×3): qty 100

## 2011-11-28 MED ORDER — FENTANYL CITRATE 0.05 MG/ML IJ SOLN
50.0000 ug/h | INTRAMUSCULAR | Status: DC
  Administered 2011-11-28: 50 ug/h via INTRAVENOUS
  Filled 2011-11-28: qty 50

## 2011-11-28 MED ORDER — SUFENTANIL CITRATE 50 MCG/ML IV SOLN
INTRAVENOUS | Status: DC | PRN
  Administered 2011-11-28 (×2): 20 ug via INTRAVENOUS
  Administered 2011-11-28: 10 ug via INTRAVENOUS

## 2011-11-28 MED ORDER — SODIUM CHLORIDE 0.9 % IV SOLN
100.0000 mg | Freq: Every day | INTRAVENOUS | Status: DC
  Administered 2011-11-29: 100 mg via INTRAVENOUS
  Filled 2011-11-28 (×2): qty 100

## 2011-11-28 MED ORDER — ONDANSETRON HCL 4 MG/2ML IJ SOLN
4.0000 mg | Freq: Four times a day (QID) | INTRAMUSCULAR | Status: DC | PRN
  Administered 2011-12-05: 4 mg via INTRAVENOUS
  Filled 2011-11-28: qty 2

## 2011-11-28 MED ORDER — 0.9 % SODIUM CHLORIDE (POUR BTL) OPTIME
TOPICAL | Status: DC | PRN
  Administered 2011-11-28: 2000 mL

## 2011-11-28 MED ORDER — LACTATED RINGERS IV SOLN: INTRAVENOUS | Status: DC

## 2011-11-28 SURGICAL SUPPLY — 71 items
BAG DECANTER FOR FLEXI CONT (MISCELLANEOUS) IMPLANT
BLADE SURG 11 STRL SS (BLADE) ×3 IMPLANT
CANISTER SUCTION 2500CC (MISCELLANEOUS) ×3 IMPLANT
CATH KIT ON Q 5IN SLV (PAIN MANAGEMENT) IMPLANT
CATH ROBINSON RED A/P 22FR (CATHETERS) IMPLANT
CATH THORACIC 28FR (CATHETERS) IMPLANT
CATH THORACIC 36FR (CATHETERS) IMPLANT
CATH THORACIC 36FR RT ANG (CATHETERS) IMPLANT
CLOTH BEACON ORANGE TIMEOUT ST (SAFETY) ×3 IMPLANT
CONN ST 1/4X3/8  BEN (MISCELLANEOUS) ×2
CONN ST 1/4X3/8 BEN (MISCELLANEOUS) IMPLANT
CONT SPEC 4OZ CLIKSEAL STRL BL (MISCELLANEOUS) ×10 IMPLANT
COVER SURGICAL LIGHT HANDLE (MISCELLANEOUS) ×6 IMPLANT
DRAIN CHANNEL 28F RND 3/8 FF (WOUND CARE) ×2 IMPLANT
DRAPE LAPAROSCOPIC ABDOMINAL (DRAPES) ×3 IMPLANT
DRAPE SLUSH MACHINE 52X66 (DRAPES) ×1 IMPLANT
DRAPE SLUSH/WARMER DISC (DRAPES) ×2 IMPLANT
DRSG OPSITE 4X5.5 SM (GAUZE/BANDAGES/DRESSINGS) ×2 IMPLANT
ELECT REM PT RETURN 9FT ADLT (ELECTROSURGICAL) ×3
ELECTRODE REM PT RTRN 9FT ADLT (ELECTROSURGICAL) ×1 IMPLANT
GLOVE BIO SURGEON STRL SZ 6.5 (GLOVE) ×1 IMPLANT
GLOVE BIO SURGEON STRL SZ7 (GLOVE) ×2 IMPLANT
GLOVE BIO SURGEON STRL SZ7.5 (GLOVE) ×6 IMPLANT
GLOVE BIO SURGEONS STRL SZ 6.5 (GLOVE) ×1
GLOVE BIOGEL PI IND STRL 7.0 (GLOVE) IMPLANT
GLOVE BIOGEL PI INDICATOR 7.0 (GLOVE) ×12
GOWN STRL NON-REIN LRG LVL3 (GOWN DISPOSABLE) ×11 IMPLANT
HANDLE STAPLE ENDO GIA SHORT (STAPLE) ×2
KIT BASIN OR (CUSTOM PROCEDURE TRAY) ×3 IMPLANT
KIT ROOM TURNOVER OR (KITS) ×3 IMPLANT
KIT SUCTION CATH 14FR (SUCTIONS) ×3 IMPLANT
NS IRRIG 1000ML POUR BTL (IV SOLUTION) ×6 IMPLANT
PACK CHEST (CUSTOM PROCEDURE TRAY) ×3 IMPLANT
PAD ARMBOARD 7.5X6 YLW CONV (MISCELLANEOUS) ×8 IMPLANT
PAIN PUMP ON-Q 400MLX5ML 5IN (MISCELLANEOUS) ×2 IMPLANT
RELOAD EGIA 45 MED/THCK PURPLE (STAPLE) ×12 IMPLANT
SEALANT PROGEL (MISCELLANEOUS) ×2 IMPLANT
SEALANT SURG COSEAL 4ML (VASCULAR PRODUCTS) IMPLANT
SOLUTION ANTI FOG 6CC (MISCELLANEOUS) ×3 IMPLANT
SPONGE GAUZE 4X4 12PLY (GAUZE/BANDAGES/DRESSINGS) ×3 IMPLANT
SPONGE TONSIL 1.25 RF SGL STRG (GAUZE/BANDAGES/DRESSINGS) ×3 IMPLANT
STAPLER ENDO GIA 12 SHRT THIN (STAPLE) IMPLANT
STAPLER ENDO GIA 12MM SHORT (STAPLE) ×1 IMPLANT
SUT CHROMIC 3 0 SH 27 (SUTURE) ×2 IMPLANT
SUT ETHILON 3 0 PS 1 (SUTURE) IMPLANT
SUT PROLENE 3 0 SH DA (SUTURE) IMPLANT
SUT PROLENE 4 0 RB 1 (SUTURE)
SUT PROLENE 4-0 RB1 .5 CRCL 36 (SUTURE) IMPLANT
SUT SILK  1 MH (SUTURE) ×4
SUT SILK 1 MH (SUTURE) ×2 IMPLANT
SUT SILK 2 0SH CR/8 30 (SUTURE) IMPLANT
SUT SILK 3 0SH CR/8 30 (SUTURE) ×2 IMPLANT
SUT VIC AB 1 CTX 18 (SUTURE) ×2 IMPLANT
SUT VIC AB 2 TP1 27 (SUTURE) ×2 IMPLANT
SUT VIC AB 2-0 CT1 27 (SUTURE) ×3
SUT VIC AB 2-0 CT1 TAPERPNT 27 (SUTURE) IMPLANT
SUT VIC AB 2-0 CTX 36 (SUTURE) ×2 IMPLANT
SUT VIC AB 3-0 X1 27 (SUTURE) ×4 IMPLANT
SUT VICRYL 0 UR6 27IN ABS (SUTURE) ×2 IMPLANT
SUT VICRYL 2 TP 1 (SUTURE) IMPLANT
SWAB COLLECTION DEVICE MRSA (MISCELLANEOUS) IMPLANT
SYSTEM SAHARA CHEST DRAIN ATS (WOUND CARE) ×3 IMPLANT
TAPE CLOTH SURG 4X10 WHT LF (GAUZE/BANDAGES/DRESSINGS) ×2 IMPLANT
TIP APPLICATOR SPRAY EXTEND 16 (VASCULAR PRODUCTS) IMPLANT
TOWEL OR 17X24 6PK STRL BLUE (TOWEL DISPOSABLE) ×3 IMPLANT
TOWEL OR 17X26 10 PK STRL BLUE (TOWEL DISPOSABLE) ×6 IMPLANT
TRAP SPECIMEN MUCOUS 40CC (MISCELLANEOUS) IMPLANT
TRAY FOLEY CATH 14FR (SET/KITS/TRAYS/PACK) ×3 IMPLANT
TUBE ANAEROBIC SPECIMEN COL (MISCELLANEOUS) IMPLANT
TUNNELER SHEATH ON-Q 11GX8 (MISCELLANEOUS) ×2 IMPLANT
WATER STERILE IRR 1000ML POUR (IV SOLUTION) ×6 IMPLANT

## 2011-11-28 NOTE — Progress Notes (Signed)
Referring physician: Richarda Overlie Reason for consult: Pulmonary nodules, dyspnea        PCCM Progress Note  HISTORY of PRESENT ILLNESS:  Robert Mcgee is a 71 y.o. male admitted on 11/21/2011 with fever, polyarthralgia, and found to have multiple pulmonary nodules.  PCCM consulted 5/13 to assess progressive b/l pulmonary infiltrates, dyspnea, and hypoxia.  PET/CT with diffuse uptake.  Started on high dose steroids and abx upon admission.   Tests/events 03/13/11 CT abd/pelvis>>8 mm RML nodule 11/17/11 CT chest>>borderline hilar/mediastinal adenopathy, b/l rounded nodules of varying size up to 2.6 cm 11/17/11 CT abd/pelvis>>abnormal soft tissue surrounding bifurcation of abdominal aorta 11/21/11 Scrotal u/s>>no evidence of testicular masses 11/24/11 PET scan>>numerous b/l pulmonary nodules with SUV up to 12.1, SUV 6.2 in abnormal soft tissue around abdominal aorta, SUV 6.1 Lt prostate   11/22/11 Echo>>mild LVH, EF 65 to 70%, grade 1 diastolic dysfx, mild AS, mild LA/RA dilation, PAS 42 mmHg 5/16: Tx to [facility] for VATS 5/17: VATS  PHYICAL EXAM:  Blood pressure 94/79, pulse 70, temperature 97.3 F (36.3 C), temperature source Oral, resp. rate 20, height 6\' 2"  (1.88 m), weight 92.7 kg (204 lb 5.9 oz), SpO2 91.00%. Body mass index is 26.24 kg/(m^2).  I/O last 3 completed shifts: In: 1064 [P.O.:465; I.V.:60; IV Piggyback:539] Out: 3810 [Urine:3810]  Intake/Output Summary (Last 24 hours) at 11/28/11 1013 Last data filed at 11/28/11 0800  Gross per 24 hour  Intake  641.5 ml  Output   2515 ml  Net -1873.5 ml      Physical exam: General - elderly male in no acute distress, HEENT - PERRLA, , shallow ulcer Rt upper gum and Lt lateral tongue jaw pain on palpation Chest - scattered rhonchi, no wheeze, diminished respiratory excursion Cardiac - s1s2 regular, no murmur Abd - soft, mild distention, +bowel sounds Ext - mild swelling in knees b/l Lt > Rt Neuro - GCS 15, no focal deficits Psych -  normal mood, behavior Skin - no rashes  ASSESSMENT/PLAN:   Pulmonary: ABG    Component Value Date/Time   PHART 7.459* 11/24/2011 2010   PCO2ART 43.9 11/24/2011 2010   PO2ART 66.8* 11/24/2011 2010   HCO3 30.2* 11/24/2011 2010   TCO2 27.1 11/24/2011 2010   O2SAT 95.1 11/24/2011 2010    ANCA>>> ANA>>>neg CEA>>>neg RF>>>18 RMSF>>>neg  A: Acute hypoxic respiratory failure with progressive b/l pulmonary infiltrates with more peripheral distribution.  This has been associated with symmetric b/l lower extremity polyarthralgia.  He required prednisone therapy to treat sinus infection in Fall 2012. More concerned about inflammatory process (more likely) vs infectious process.  Malignancy seems less likely given that he had a normal chest xray in April 2013, and has since developed rapidly progressive airspace disease.   His ESR was 69 on admission.  CPK, uric acid, LDH normal.  He had mild elevation in TSH (6.679).  ANA, ACE, RF, Hep A/B/C, Cryptococcal Ag all negative.  B burgdorferi Ab IgG + IgM 1.41.  Histoplasma Ab, RMSF IgM/IgG pending.  The patient has had >48 hours of steroids but O2 demand unfortunately has not improved dramatically as originally hoped.  He also failed to respond to diureses.    Plan: -VATS today 5/17 -continue supplemental oxygen/ possible vent support post-op   - Continue solumedrol 60 mg IV q6hrs. - F/u CXR in AM. -hold diureses today -continue empiric ABX  Cardiac:  Lab 11/22/11 0924 11/22/11 0020 11/21/11 1700  CKTOTAL 44 41 62  CKMB 2.0 1.9 2.9  TROPONINI <0.30 <0.30 <0.30  ECHO: EF 65%, grade I diastolic dysfunction and PAP of 42.  A: Hypertension>>no hx of this as outpt.   P: - Monitor    Renal: BMET    Component Value Date/Time   NA 135 11/27/2011 0313   K 4.0 11/27/2011 0313   CL 98 11/27/2011 0313   CO2 27 11/27/2011 0313   GLUCOSE 175* 11/27/2011 0313   BUN 26* 11/27/2011 0313   CREATININE 0.87 11/27/2011 0313   CALCIUM 8.8 11/27/2011 0313     GFRNONAA 85* 11/27/2011 0313   GFRAA >90 11/27/2011 0313     5/16 UA: Hyaline casts  A: Ureteral stone  - Seen by urology. - F/u with urology as outpt.  Musculoskeletal: Lt knee aspiration 5/11>>neg  A: Joint pain, getting better  P: - Continue pain meds   Gastro-intestinal:   A: Constipation, Mouth ulcers, abnormal soft tissue swelling around abdominal aorta - NPO for VATS - Continue bowel regimen. - Monitor oral exam. - May need additional biopsy of soft tissue mass around abdominal aorta or at least re-image but will get lung biopsy first.  ID:  Antibiotics: Doxycycline 5/10>>5/11 Zosyn 5/10>> Vancomycin 5/10>>  Cultures: Blood 5/10>>neg Urine 5/10>>neg Lt knee 5/11>>neg Blood 5/11>> Sputum 5/14>>> neg   Lab 11/27/11 0313 11/26/11 0417 11/25/11 0337  WBC 19.7* 22.3* 12.1*   A: Leukocytosis- trending down P: -f/u ID recs -f/u VATS biopsy -Continue ABX -D/W ID>>continue current ABx and okay to start steroids  Endocrine: A: Steroid induced hyperglycemia P: - monitor CBG/SSI  A: Mild elevation in TSH P: - Will need further assessment as outpt.  BEST PRACTICE / DISPOSITION - Level of Care:  ICU - Primary Service: PCCM - Consultants: CVTS, ID - Code Status:  Full - Diet:  NPO - DVT Px: Lovenox - GI Px:  protonix   BABCOCK,PETE 11/28/2011, 10:13 AM Pager:  [phone]    Seen on CCM rounds this morning with resident MD or ACNP above.  Pt examined and database reviewed. I agree with above findings, assessment and plan as reflected in the note above. Family updated @ bedside  Billy Fischer, MD;  PCCM service; Mobile [phone]

## 2011-11-28 NOTE — Anesthesia Preprocedure Evaluation (Addendum)
Anesthesia Evaluation  Patient identified by MRN, date of birth, ID band Patient awake    Reviewed: Allergy & Precautions, H&P , NPO status , Patient's Chart, lab work & pertinent test results  Airway Mallampati: II TM Distance: <3 FB Neck ROM: Full    Dental  (+) Edentulous Upper   Pulmonary  Pulm. Nodules, infiltrates         Cardiovascular     Neuro/Psych    GI/Hepatic   Endo/Other    Renal/GU      Musculoskeletal   Abdominal   Peds  Hematology   Anesthesia Other Findings   Reproductive/Obstetrics                         Anesthesia Physical Anesthesia Plan  ASA: IV  Anesthesia Plan: General   Post-op Pain Management:    Induction: Intravenous  Airway Management Planned: Double Lumen EBT  Additional Equipment: Arterial line, CVP and Ultrasound Guidance Line Placement  Intra-op Plan:   Post-operative Plan: Post-operative intubation/ventilation  Informed Consent: I have reviewed the patients History and Physical, chart, labs and discussed the procedure including the risks, benefits and alternatives for the proposed anesthesia with the patient or authorized representative who has indicated his/her understanding and acceptance.   Dental advisory given  Plan Discussed with: CRNA, Anesthesiologist and Surgeon  Anesthesia Plan Comments:         Anesthesia Quick Evaluation

## 2011-11-28 NOTE — Anesthesia Procedure Notes (Addendum)
Performed by: Glendora Score A   Procedure Name: Intubation Date/Time: 11/28/2011 4:13 PM Performed by: Glendora Score A Pre-anesthesia Checklist: Patient identified, Emergency Drugs available, Suction available and Patient being monitored Patient Re-evaluated:Patient Re-evaluated prior to inductionOxygen Delivery Method: Circle system utilized Preoxygenation: Pre-oxygenation with 100% oxygen Intubation Type: IV induction Ventilation: Mask ventilation without difficulty and Oral airway inserted - appropriate to patient size Laryngoscope Size: Mac and 3 Grade View: Grade I Endobronchial tube: Double lumen EBT, Left, EBT position confirmed by auscultation and EBT position confirmed by fiberoptic bronchoscope and 39 Fr Number of attempts: 1 Airway Equipment and Method: Stylet Placement Confirmation: ETT inserted through vocal cords under direct vision,  positive ETCO2 and breath sounds checked- equal and bilateral Tube secured with: Tape Dental Injury: Teeth and Oropharynx as per pre-operative assessment

## 2011-11-28 NOTE — Preoperative (Signed)
Beta Blockers   Reason not to administer Beta Blockers:Not Applicable 

## 2011-11-28 NOTE — Anesthesia Postprocedure Evaluation (Signed)
  Anesthesia Post-op Note  Patient: Robert Mcgee  Procedure(s) Performed: Procedure(s) (LRB): VIDEO ASSISTED THORACOSCOPY (Left) LUNG BIOPSY (Left)  Patient Location: PACU  Anesthesia Type: General  Level of Consciousness: Patient remains intubated per anesthesia plan  Airway and Oxygen Therapy: Patient remains intubated per anesthesia plan and Patient placed on Ventilator (see vital sign flow sheet for setting)  Post-op Pain: none  Post-op Assessment: Post-op Vital signs reviewed  Post-op Vital Signs: stable  Complications: No apparent anesthesia complications

## 2011-11-28 NOTE — Transfer of Care (Signed)
Immediate Anesthesia Transfer of Care Note  Patient: Robert Mcgee  Procedure(s) Performed: Procedure(s) (LRB): VIDEO ASSISTED THORACOSCOPY (Left) LUNG BIOPSY (Left)  Patient Location: ICU  Anesthesia Type: General  Level of Consciousness: Patient remains intubated per anesthesia plan  Airway & Oxygen Therapy: Patient remains intubated per anesthesia plan and Patient placed on Ventilator (see vital sign flow sheet for setting)  Post-op Assessment: Post -op Vital signs reviewed and stable  Post vital signs: Reviewed and stable  Complications: No apparent anesthesia complications

## 2011-11-28 NOTE — Brief Op Note (Signed)
11/21/2011 - 11/28/2011  5:38 PM  PATIENT:  Robert Mcgee  71 y.o. male  PRE-OPERATIVE DIAGNOSIS: Multiple pulmonary nodules  POST-OPERATIVE DIAGNOSIS:   Interstitial lung disease  PROCEDURE:  LEFT VIDEO ASSISTED THORACOSCOPY,  LUNG BIOPSIES OF LEFT UPPER AND LOWER LOBES  SURGEON:  Surgeon(s) and Role:    * Kerin Perna, MD - Primary  PHYSICIAN ASSISTANT: Doree Fudge PA-C   ANESTHESIA:   general  EBL:  Total I/O In: 50 [IV Piggyback:50] Out: 150 [Urine:150]  BLOOD ADMINISTERED:none  DRAINS: (One) Blake drain(s) in the Left pleural space    SPECIMEN:  Source of Specimen:  Lung biopsies of LUL AND LLL AFB and cultures also sent to microbiology  DISPOSITION OF SPECIMEN:  PATHOLOGY  COUNTS CORRECT:  YES  DICTATION: .Dragon Dictation  PLAN OF CARE: Admit to inpatient   PATIENT DISPOSITION:  ICU - intubated and hemodynamically stable.   Delay start of Pharmacological VTE agent (>24hrs) due to surgical blood loss or risk of bleeding: yes

## 2011-11-28 NOTE — Progress Notes (Addendum)
ID PROGRESS NOTE  ID: 70 y.o. male admitted on 11/21/2011 with fever, polyarthralgia, and found to have multiple pulmonary nodules  24 hr: transferred from [facility] to [facility] for VATS to get tissue biopsy  Abtx:  vanco #8 piptazo #8     . antiseptic oral rinse  15 mL Mouth Rinse q12n4p  . cefUROXime (ZINACEF)  IV  1.5 g Intravenous 60 min Pre-Op  . chlorhexidine  15 mL Mouth Rinse BID  . docusate sodium  100 mg Oral BID  . enoxaparin (LOVENOX) injection  40 mg Subcutaneous Q24H  . feeding supplement  237 mL Oral BID BM  . furosemide  20 mg Intravenous Q6H  . insulin aspart  0-20 Units Subcutaneous Q4H  . methylPREDNISolone (SOLU-MEDROL) injection  60 mg Intravenous Q6H  . pantoprazole  40 mg Oral Q1200  . piperacillin-tazobactam (ZOSYN)  IV  3.375 g Intravenous Q8H  . polyethylene glycol  17 g Oral Daily  . potassium chloride  40 mEq Oral TID  . senna  1 tablet Oral BID  . Tamsulosin HCl  0.4 mg Oral Daily  . vancomycin  1,000 mg Intravenous Q8H   Labs: CBC    Component Value Date/Time   WBC 19.7* 11/27/2011 0313   RBC 3.81* 11/27/2011 0313   HGB 11.2* 11/27/2011 0313   HCT 33.8* 11/27/2011 0313   PLT 720* 11/27/2011 0313   MCV 88.7 11/27/2011 0313   MCH 29.4 11/27/2011 0313   MCHC 33.1 11/27/2011 0313   RDW 14.8 11/27/2011 0313   LYMPHSABS 1.2 11/25/2011 0337   MONOABS 0.3 11/25/2011 0337   EOSABS 0.0 11/25/2011 0337   BASOSABS 0.0 11/25/2011 0337    BMET    Component Value Date/Time   NA 135 11/27/2011 0313   K 4.0 11/27/2011 0313   CL 98 11/27/2011 0313   CO2 27 11/27/2011 0313   GLUCOSE 175* 11/27/2011 0313   BUN 26* 11/27/2011 0313   CREATININE 0.87 11/27/2011 0313   CALCIUM 8.8 11/27/2011 0313   GFRNONAA 85* 11/27/2011 0313   GFRAA >90 11/27/2011 0313    Histo negative  Micro: 5/10 blood cx NGTD 5/10 urine cx NGTD 5/11 synovial fluid cx NGTD 5/14 Sputum cx : c.albicans  A/P: 71yo M with diffuse pulmonary nodules and 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  multilobar pulm nodule/infiltrates = please make sure to send for fungal, AFB culture in addition to path. They appear to be evolving on cxr over the last week. Would recommend to start empiric antifungal therapy with micafungin and then decide to narrow once biopsy path and culture results return. Histo titer negative  Empiric antibiotics= will need to discuss with primary team to discontinue vanco and piptazo since he has been on it for 8 days pneumonia coverage,  Thrombocytosis= elevated platelets can be seen as an acute phase reactant, however 700s is quite elevated.  Will see throughout the weekend.  Robert Salvia Drue Second MD MPH [facility] [phone]

## 2011-11-29 ENCOUNTER — Inpatient Hospital Stay (HOSPITAL_COMMUNITY): Payer: Medicare Other

## 2011-11-29 LAB — CBC
Platelets: 712 10*3/uL — ABNORMAL HIGH (ref 150–400)
RDW: 15.4 % (ref 11.5–15.5)
WBC: 20.2 10*3/uL — ABNORMAL HIGH (ref 4.0–10.5)

## 2011-11-29 LAB — BASIC METABOLIC PANEL
Chloride: 103 mEq/L (ref 96–112)
Creatinine, Ser: 0.79 mg/dL (ref 0.50–1.35)
GFR calc Af Amer: >90 mL/min (ref >90)
Potassium: 4.5 mEq/L (ref 3.5–5.1)
Sodium: 137 mEq/L (ref 135–145)

## 2011-11-29 LAB — GLUCOSE, CAPILLARY
Glucose-Capillary: 191 mg/dL — ABNORMAL HIGH (ref 70–99)
Glucose-Capillary: 151 mg/dL — ABNORMAL HIGH (ref 70–99)
Glucose-Capillary: 136 mg/dL — ABNORMAL HIGH (ref 70–99)
Glucose-Capillary: 191 mg/dL — ABNORMAL HIGH (ref 70–99)
Glucose-Capillary: 217 mg/dL — ABNORMAL HIGH (ref 70–99)

## 2011-11-29 LAB — POCT I-STAT 3, ART BLOOD GAS (G3+)
Acid-Base Excess: 2 mmol/L (ref 0.0–2.0)
Bicarbonate: 27.1 mEq/L — ABNORMAL HIGH (ref 20.0–24.0)
Patient temperature: 97.5
TCO2: 28 mmol/L (ref 0–100)
pH, Arterial: 7.42 (ref 7.350–7.450)
pO2, Arterial: 69 mmHg — ABNORMAL LOW (ref 80.0–100.0)

## 2011-11-29 MED ORDER — SULFAMETHOXAZOLE-TRIMETHOPRIM 400-80 MG PO TABS
1.0000 | ORAL_TABLET | Freq: Once | ORAL | Status: AC
  Administered 2011-11-29: 1 via ORAL
  Filled 2011-11-29 (×3): qty 1

## 2011-11-29 MED ORDER — SODIUM CHLORIDE 0.9 % IV SOLN
1000.0000 mg | Freq: Every day | INTRAVENOUS | Status: AC
  Administered 2011-11-29 – 2011-12-01 (×3): 1000 mg via INTRAVENOUS
  Filled 2011-11-29 (×3): qty 8

## 2011-11-29 NOTE — Progress Notes (Addendum)
1 Day Post-Op Procedure(s) (LRB): VIDEO ASSISTED THORACOSCOPY (Left) LUNG BIOPSY (Left)  Subjective: Patient awake, alert, and without complaints.  Objective: Vital signs in last 24 hours: Patient Vitals for the past 24 hrs:  BP Temp Temp src Pulse Resp SpO2  11/29/11 1200 141/69 mmHg - - - 20  94 %  11/29/11 1100 151/75 mmHg - - 65  16  98 %  11/29/11 1030 145/71 mmHg - - - - -  11/29/11 1006 - - - 80  24  -  11/29/11 1000 144/71 mmHg - - - - -  11/29/11 0900 117/65 mmHg - - 72  22  -  11/29/11 0815 112/61 mmHg - - - - -  11/29/11 0800 107/61 mmHg - - 63  20  -  11/29/11 0755 - 97.8 F (36.6 C) Oral - - -  11/29/11 0743 109/56 mmHg - - 57  16  -  11/29/11 0700 - - - 58  - -  11/29/11 0600 - - - 56  - -  11/29/11 0500 - - - 57  - -  11/29/11 0436 - 97.5 F (36.4 C) Oral - - -  11/29/11 0400 - - - 56  - -  11/29/11 0300 - - - 58  - -  11/29/11 0200 - - - 59  - -  11/29/11 0100 - - - 58  - -  11/29/11 0054 - 97.2 F (36.2 C) Oral - - -  11/29/11 0000 95/51 mmHg - - 62  - -  11/28/11 2300 100/54 mmHg - - 62  - -  11/28/11 2100 92/54 mmHg - - 57  - -  11/28/11 2000 - 97.6 F (36.4 C) Oral 60  - -  11/28/11 1850 94/50 mmHg - - 67  - 98 %  11/28/11 1519 - - - 73  23  94 %  11/28/11 1518 - - - 71  17  91 %  11/28/11 1517 - - - 71  19  92 %  11/28/11 1516 - - - 72  19  88 %  11/28/11 1515 - - - 70  19  92 %  11/28/11 1514 - - - 72  20  89 %    Current Weight  11/28/11 204 lb 5.9 oz (92.7 kg)      Intake/Output from previous day: 05/17 0701 - 05/18 0700 In: 3566.7 [I.V.:2331.7; IV Piggyback:1135] Out: 705 [Urine:700; Chest Tube:5]   Physical Exam:  Cardiovascular: RRR. Pulmonary: Diminished at left base;right lung is clear; no rales, wheezes, or rhonchi. Abdomen: Soft, non tender, bowel sounds present. Extremities: SCDs in place. Wounds: Dressing is clean and dry.   Lab Results: CBC: Basename 11/29/11 0430 11/27/11 0313  WBC 20.2* 19.7*  HGB 10.5* 11.2*    HCT 32.6* 33.8*  PLT 712* 720*   BMET:  Basename 11/29/11 0430 11/27/11 0313  NA 137 135  K 4.5 4.0  CL 103 98  CO2 25 27  GLUCOSE 156* 175*  BUN 30* 26*  CREATININE 0.79 0.87  CALCIUM 8.4 8.8    PT/INR:  Basename 11/28/11 0342  LABPROT 14.6  INR 1.12   ABG:  INR: Will add last result for INR, ABG once components are confirmed Will add last 4 CBG results once components are confirmed  Assessment/Plan:  Pulmonary-Chest tube with minimal output.Chest tube is to suction  and there is no air leak.May consider placing to water seal.CXR this am shows extensive bilateral pulmonary opacities, small left pleural effusion, and  no pneumothorax.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 WBC slightly increased, but is on hight dose steroids. Management per CCM.  ZIMMERMAN,DONIELLE MPA-C 11/29/2011   I have seen and examined the patient and agree with the assessment and plan as outlined.  Chest tube to water seal.  Lynesha Bango H 11/29/2011 5:24 PM

## 2011-11-29 NOTE — Progress Notes (Addendum)
ID PROGRESS NOTE  ID: 71 y.o. male admitted on 11/21/2011 with fever, polyarthralgia, and found to have multiple pulmonary nodules  24 hr: underwent VATS, frozen path suggested ILD pathology, but awaiting for further description if c/w wegener's  Labs returned with + C-ANCA  Abtx:  vanco -finished 8 day course piptazo- finished 8 day course micafungin day #2    . acetaminophen  1,000 mg Intravenous Q6H  . antiseptic oral rinse  15 mL Mouth Rinse q12n4p  . chlorhexidine  15 mL Mouth Rinse BID  . enoxaparin (LOVENOX) injection  40 mg Subcutaneous Q24H  . insulin aspart  0-24 Units Subcutaneous Q4H  . methylPREDNISolone (SOLU-MEDROL) injection  1,000 mg Intravenous Daily  . micafungin [facility]) IV  100 mg Intravenous Daily  . pantoprazole (PROTONIX) IV  40 mg Intravenous Q1200  . polyethylene glycol  17 g Oral Daily  . senna  1 tablet Oral BID  . sulfamethoxazole-trimethoprim  1 tablet Oral Once  . DISCONTD: cefUROXime (ZINACEF)  IV  1.5 g Intravenous 60 min Pre-Op  . DISCONTD: docusate sodium  100 mg Oral BID  . DISCONTD: enoxaparin (LOVENOX) injection  40 mg Subcutaneous Q24H  . DISCONTD: feeding supplement  237 mL Oral BID BM  . DISCONTD: insulin aspart  0-20 Units Subcutaneous Q4H  . DISCONTD: methylPREDNISolone (SOLU-MEDROL) injection  60 mg Intravenous Q6H  . DISCONTD: pantoprazole  40 mg Oral Q1200  . DISCONTD: piperacillin-tazobactam (ZOSYN)  IV  3.375 g Intravenous Q8H  . DISCONTD: Tamsulosin HCl  0.4 mg Oral Daily  . DISCONTD: vancomycin  1,000 mg Intravenous Q8H   Labs: CBC    Component Value Date/Time   WBC 20.2* 11/29/2011 0430   RBC 3.54* 11/29/2011 0430   HGB 10.5* 11/29/2011 0430   HCT 32.6* 11/29/2011 0430   PLT 712* 11/29/2011 0430   MCV 92.1 11/29/2011 0430   MCH 29.7 11/29/2011 0430   MCHC 32.2 11/29/2011 0430   RDW 15.4 11/29/2011 0430   LYMPHSABS 1.2 11/25/2011 0337   MONOABS 0.3 11/25/2011 0337   EOSABS 0.0 11/25/2011 0337   BASOSABS 0.0 11/25/2011 0337     BMET    Component Value Date/Time   NA 137 11/29/2011 0430   K 4.5 11/29/2011 0430   CL 103 11/29/2011 0430   CO2 25 11/29/2011 0430   GLUCOSE 156* 11/29/2011 0430   BUN 30* 11/29/2011 0430   CREATININE 0.79 11/29/2011 0430   CALCIUM 8.4 11/29/2011 0430   GFRNONAA 88* 11/29/2011 0430   GFRAA >90 11/29/2011 0430    Histo negative  Micro: 5/10 blood cx NGTD 5/10 urine cx NGTD 5/11 synovial fluid cx NGTD 5/14 Sputum cx : c.albicans  A/P: 71yo M with diffuse pulmonary nodules and 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 Recommend to discontinue micafungin.  Steroids for wegener's = if patient is thought to need steroid of equivalent of pred 20mg  > 4-67mo., would recommend prophylaxis of bactrim SS daily. Will defer to rheumatology for management of auto-immune process and the utilization of steroid sparing agents.  Positive lyme antibodies = can be falsely positive in those with auto-immune. Western blot is more specific, and those tests were negative. No need for doxycycline and not the etiology for this arthritis. Polyarthritis is likely related to auto-immune process  Will sign off, call if questions.  Duke Salvia Drue Second MD  [facility] [phone]

## 2011-11-29 NOTE — Progress Notes (Signed)
PCCM Progress Note  HISTORY of PRESENT ILLNESS:  Delmore Sear is a 71 y.o. male admitted on 11/21/2011 with fever, polyarthralgia, and found to have multiple pulmonary nodules.  PCCM consulted 5/13 to assess progressive b/l pulmonary infiltrates, dyspnea, and hypoxia.  PET/CT with diffuse uptake.  Started on high dose steroids and abx upon admission.   Tests/events 03/13/11 CT abd/pelvis>>8 mm RML nodule 11/17/11 CT chest>>borderline hilar/mediastinal adenopathy, b/l rounded nodules of varying size up to 2.6 cm 11/17/11 CT abd/pelvis>>abnormal soft tissue surrounding bifurcation of abdominal aorta 11/21/11 Scrotal u/s>>no evidence of testicular masses 11/24/11 PET scan>>numerous b/l pulmonary nodules with SUV up to 12.1, SUV 6.2 in abnormal soft tissue around abdominal aorta, SUV 6.1 Lt prostate   11/22/11 Echo>>mild LVH, EF 65 to 70%, grade 1 diastolic dysfx, mild AS, mild LA/RA dilation, PAS 42 mmHg 5/16: Tx to [facility] for VATS 5/17: VATS  PHYICAL EXAM:  Blood pressure 112/61, pulse 63, temperature 97.8 F (36.6 C), temperature source Oral, resp. rate 20, height 6\' 2"  (1.88 m), weight 92.7 kg (204 lb 5.9 oz), SpO2 98.00%. Body mass index is 26.24 kg/(m^2).  I/O last 3 completed shifts: In: 3651.7 [I.V.:2391.7; Other:100; IV Piggyback:1160] Out: 1355 [Urine:1350; Chest Tube:5]  Intake/Output Summary (Last 24 hours) at 11/29/11 0915 Last data filed at 11/29/11 0830  Gross per 24 hour  Intake 3747.9 ml  Output    555 ml  Net 3192.9 ml   Vent Mode:  [-] CPAP FiO2 (%):  [40 %-60 %] 40 % Set Rate:  [12 bmp] 12 bmp Vt Set:  [600 mL] 600 mL PEEP:  [5 cmH20] 5 cmH20 Pressure Support:  [5 cmH20] 5 cmH20 Plateau Pressure:  [15 cmH20-18 cmH20] 15 cmH20  Physical exam: General - elderly male intubated in no distress HEENT - PERRLA, , shallow ulcer Rt upper gum and Lt lateral tongue jaw pain on palpation Chest - clear, diminished on the left, left CT without air leak  Cardiac -  s1s2 regular, no murmur Abd - soft, nontender +bowel sounds Ext - no edema  Neuro - GCS 15, no focal deficits Psych - normal mood, behavior Skin - no rashes  ASSESSMENT/PLAN:   Pulmonary: ABG    Component Value Date/Time   PHART 7.420 11/29/2011 0451   PCO2ART 41.6 11/29/2011 0451   PO2ART 69.0* 11/29/2011 0451   HCO3 27.1* 11/29/2011 0451   TCO2 28 11/29/2011 0451   O2SAT 94.0 11/29/2011 0451    ANCA>>>POS  1: 640  !!!!! ANA>>>neg CEA>>>neg RF>>>18 RMSF>>>neg  A: Acute hypoxic respiratory failure with progressive b/l pulmonary infiltrates with more peripheral distribution.  This has been associated with symmetric b/l lower extremity polyarthralgia.  He required prednisone therapy to treat sinus infection in Fall 2012. More concerned about inflammatory process (more likely) vs infectious process.  Malignancy seems less likely given that he had a normal chest xray in April 2013, and has since developed rapidly progressive airspace disease.   His ESR was 69 on admission.  CPK, uric acid, LDH normal.  He had mild elevation in TSH (6.679).  ANA, ACE, RF, Hep A/B/C, Cryptococcal Ag all negative.  B burgdorferi Ab IgG + IgM 1.41.  Histoplasma Ab, RMSF IgM/IgG pending.  The patient has had >48 hours of steroids but O2 demand unfortunately has not improved dramatically as originally hoped.  He also failed to respond to diureses.    JUST BACK: C ANCA veyr high, likley Weg Gran  Plan: -will pulse solumedrol now 1 gram  x 3 days then will determine cytoxan course , await protease 3 - F/u CXR in AM. -hold diureses today, although look for even balance -continue empiric ABX  A: Acute respiratory failure S/P VATS P: -wean cpap 5 ps 5 assess rsbi, strength -Pulmonary hygiene  Treat likely C anca pos weg  Cardiac:  Lab 11/22/11 0924  CKTOTAL 44  CKMB 2.0  TROPONINI <0.30   ECHO: EF 65%, grade I diastolic dysfunction and PAP of 42.  A: Hypertension-resolved P: - Monitor   -tele cvp  Renal: BMET    Component Value Date/Time   NA 137 11/29/2011 0430   K 4.5 11/29/2011 0430   CL 103 11/29/2011 0430   CO2 25 11/29/2011 0430   GLUCOSE 156* 11/29/2011 0430   BUN 30* 11/29/2011 0430   CREATININE 0.79 11/29/2011 0430   CALCIUM 8.4 11/29/2011 0430   GFRNONAA 88* 11/29/2011 0430   GFRAA >90 11/29/2011 0430     5/16 UA: Hyaline casts  A: Ureteral stone  - Seen by urology. - F/u with urology as outpt.  Musculoskeletal: Lt knee aspiration 5/11>>neg  A: Joint pain, getting better  P: - Continue pain meds   Gastro-intestinal:  A: Constipation, Mouth ulcers, abnormal soft tissue swelling around abdominal aorta - NPO post  VATS - Continue bowel regimen. - Monitor oral exam  ID:  Antibiotics: Doxycycline 5/10>>5/11 Zosyn 5/10>> Vancomycin 5/10>> Micafungin 5/17>>>  Cultures: Blood 5/10>>neg Urine 5/10>>neg Lt knee 5/11>>neg Sputum 5/14>>> neg   Lab 11/29/11 0430 11/27/11 0313 11/26/11 0417  WBC 20.2* 19.7* 22.3*   A: Leukocytosis P: -Micafungin per ID, likely can dc this with C anca findings -f/u VATS biopsy/cultures -d/c vano and zosyn per ID  Endocrine: A: Steroid induced hyperglycemia P: - monitor CBG/SSI  A: Mild elevation in TSH P: - Will need further assessment as outpt.  BEST PRACTICE / DISPOSITION - Level of Care:  ICU - Primary Service: PCCM - Consultants: CVTS, ID - Code Status:  Full - Diet:  NPO - DVT Px: Lovenox - GI Px:  protonix   Ccm 30 min  Mcarthur Rossetti. Tyson Alias, MD, FACP Pgr: [phone] [facility]

## 2011-11-29 NOTE — Procedures (Signed)
Extubation Procedure Note  Patient Details:   Name: Robert Mcgee DOB: 1941/03/14 MRN: [id number]   Airway Documentation:   Pt extubated to 4L [place] per MD order, vitals stable, no stridor noted. RT to monitor.  Evaluation  O2 sats: stable throughout Complications: No apparent complications Patient did tolerate procedure well. Bilateral Breath Sounds: Diminished Suctioning: Oral Yes  Harley Hallmark 11/29/2011, 10:09 AM

## 2011-11-30 ENCOUNTER — Inpatient Hospital Stay (HOSPITAL_COMMUNITY): Payer: Medicare Other

## 2011-11-30 LAB — URINALYSIS, ROUTINE W REFLEX MICROSCOPIC
Protein, ur: NEGATIVE mg/dL
Urobilinogen, UA: 0.2 mg/dL (ref 0.0–1.0)

## 2011-11-30 LAB — DIFFERENTIAL
Eosinophils Relative: 0 % (ref 0–5)
Lymphocytes Relative: 5 % — ABNORMAL LOW (ref 12–46)
Lymphs Abs: 1.3 10*3/uL (ref 0.7–4.0)
Monocytes Absolute: 1.3 10*3/uL — ABNORMAL HIGH (ref 0.1–1.0)

## 2011-11-30 LAB — CBC
MCH: 29.5 pg (ref 26.0–34.0)
MCHC: 32.7 g/dL (ref 30.0–36.0)
Platelets: 807 10*3/uL — ABNORMAL HIGH (ref 150–400)
RDW: 15.8 % — ABNORMAL HIGH (ref 11.5–15.5)

## 2011-11-30 LAB — URINE MICROSCOPIC-ADD ON

## 2011-11-30 LAB — COMPREHENSIVE METABOLIC PANEL
ALT: 24 U/L (ref 0–53)
AST: 15 U/L (ref 0–37)
CO2: 23 mEq/L (ref 19–32)
Calcium: 8.3 mg/dL — ABNORMAL LOW (ref 8.4–10.5)
GFR calc non Af Amer: >90 mL/min (ref >90)
Sodium: 140 mEq/L (ref 135–145)
Total Protein: 5.8 g/dL — ABNORMAL LOW (ref 6.0–8.3)

## 2011-11-30 LAB — GLUCOSE, CAPILLARY
Glucose-Capillary: 163 mg/dL — ABNORMAL HIGH (ref 70–99)
Glucose-Capillary: 151 mg/dL — ABNORMAL HIGH (ref 70–99)

## 2011-11-30 MED ORDER — FUROSEMIDE 10 MG/ML IJ SOLN: 40.0000 mg | Freq: Two times a day (BID) | INTRAMUSCULAR | Status: DC

## 2011-11-30 MED ORDER — PANTOPRAZOLE SODIUM 40 MG PO TBEC
40.0000 mg | DELAYED_RELEASE_TABLET | Freq: Every day | ORAL | Status: DC
  Administered 2011-12-01 – 2011-12-09 (×9): 40 mg via ORAL
  Filled 2011-11-30 (×9): qty 1

## 2011-11-30 MED ORDER — SODIUM CHLORIDE 0.9 % IJ SOLN
10.0000 mL | INTRAMUSCULAR | Status: DC | PRN
  Administered 2011-11-30 – 2011-12-05 (×4): 10 mL via INTRAVENOUS
  Filled 2011-11-30: qty 10
  Filled 2011-11-30: qty 20

## 2011-11-30 MED ORDER — SULFAMETHOXAZOLE-TRIMETHOPRIM 400-80 MG PO TABS
1.0000 | ORAL_TABLET | Freq: Once | ORAL | Status: AC
  Administered 2011-11-30: 1 via ORAL
  Filled 2011-11-30: qty 1

## 2011-11-30 MED ORDER — FUROSEMIDE 10 MG/ML IJ SOLN
INTRAMUSCULAR | Status: AC
  Administered 2011-11-30: 40 mg
  Filled 2011-11-30: qty 4

## 2011-11-30 MED ORDER — GUAIFENESIN ER 600 MG PO TB12
600.0000 mg | ORAL_TABLET | Freq: Two times a day (BID) | ORAL | Status: DC
  Administered 2011-11-30 – 2011-12-05 (×12): 600 mg via ORAL
  Filled 2011-11-30 (×14): qty 1

## 2011-11-30 MED ORDER — FUROSEMIDE 10 MG/ML IJ SOLN
40.0000 mg | Freq: Two times a day (BID) | INTRAMUSCULAR | Status: AC
  Administered 2011-12-01: 40 mg via INTRAVENOUS
  Filled 2011-11-30: qty 4

## 2011-11-30 NOTE — Progress Notes (Signed)
PCCM Progress Note  HISTORY of PRESENT ILLNESS:  Robert Mcgee is a 71 y.o. male admitted on 11/21/2011 with fever, polyarthralgia, and found to have multiple pulmonary nodules.  PCCM consulted 5/13 to assess progressive b/l pulmonary infiltrates, dyspnea, and hypoxia.  PET/CT with diffuse uptake.  Started on high dose steroids and abx upon admission.   Tests/events 03/13/11 CT abd/pelvis>>8 mm RML nodule 11/17/11 CT chest>>borderline hilar/mediastinal adenopathy, b/l rounded nodules of varying size up to 2.6 cm 11/17/11 CT abd/pelvis>>abnormal soft tissue surrounding bifurcation of abdominal aorta 11/21/11 Scrotal u/s>>no evidence of testicular masses 11/24/11 PET scan>>numerous b/l pulmonary nodules with SUV up to 12.1, SUV 6.2 in abnormal soft tissue around abdominal aorta, SUV 6.1 Lt prostate   11/22/11 Echo>>mild LVH, EF 65 to 70%, grade 1 diastolic dysfx, mild AS, mild LA/RA dilation, PAS 42 mmHg 5/16: Tx to [facility] for VATS 5/17: VATS 5/18: Extubated, C- ANCA HIGH  PHYICAL EXAM:  Blood pressure 124/61, pulse 58, temperature 98.1 F (36.7 C), temperature source Oral, resp. rate 16, height 6\' 2"  (1.88 m), weight 94.8 kg (208 lb 15.9 oz), SpO2 91.00%. Body mass index is 26.83 kg/(m^2).  I/O last 3 completed shifts: In: 6840.4 [P.O.:920; I.V.:4200.4; Other:100; IV Piggyback:1620] Out: 2540 [Urine:2475; Chest Tube:65]  Intake/Output Summary (Last 24 hours) at 11/30/11 0902 Last data filed at 11/30/11 0800  Gross per 24 hour  Intake   3855 ml  Output   1735 ml  Net   2120 ml      Physical exam: General - elderly male in no acute distress HEENT - PERRLA Chest - coarse, left CT sanguinous draingage  without air leak  Cardiac - s1s2 regular, no murmur Abd - soft, nontender +bowel sounds Ext - no edema  Neuro - GCS 15, no focal deficits Psych - normal mood, behavior Skin - no rashes  ASSESSMENT/PLAN:   Pulmonary: ABG    Component Value Date/Time   PHART 7.420  11/29/2011 0451   PCO2ART 41.6 11/29/2011 0451   PO2ART 69.0* 11/29/2011 0451   HCO3 27.1* 11/29/2011 0451   TCO2 28 11/29/2011 0451   O2SAT 94.0 11/29/2011 0451    ANCA>>>POS  1: 640  !!!!! ANA>>>neg CEA>>>neg RF>>>18 RMSF>>>neg  A: Acute hypoxic respiratory failure with progressive b/l pulmonary infiltrates with more peripheral distribution.  This has been associated with symmetric b/l lower extremity polyarthralgia.  He required prednisone therapy to treat sinus infection in Fall 2012. More concerned about inflammatory process (more likely) vs infectious process.  Malignancy seems less likely given that he had a normal chest xray in April 2013, and has since developed rapidly progressive airspace disease.   His ESR was 69 on admission.  CPK, uric acid, LDH normal.  He had mild elevation in TSH (6.679).  ANA, ACE, RF, Hep A/B/C, Cryptococcal Ag all negative.  B burgdorferi Ab IgG + IgM 1.41.  Histoplasma Ab, RMSF IgM/IgG pending.  The patient has had >48 hours of steroids but O2 demand unfortunately has not improved dramatically as originally hoped.  He also failed to respond to diureses.    5/18: C ANCA veyr high, likley Weg Gran  Plan: -will pulse solumedrol 1 gram x 3 (5/18, 5/19, 5/20) days then will determine cytoxan course , await protease 3 and path - F/u CXR in AM. -pos balance over 2 lit and refected in sats and pcxr = lasix  A: Acute respiratory failure S/P VATS left chest tube to water seal P: -continue supplemental oxygen, sats >92%  -  Pulmonary hygiene  Treat likely C anca pos weg -lasix pcxr in am   Cardiac: No results found for this basename: CKTOTAL:3,CKMB:3,TROPONINI:3 in the last 168 hours ECHO: EF 65%, grade I diastolic dysfunction and PAP of 42.  A: Hypertension-resolved P: - Monitor  -tele  Renal: BMET    Component Value Date/Time   NA 140 11/30/2011 0500   K 4.1 11/30/2011 0500   CL 106 11/30/2011 0500   CO2 23 11/30/2011 0500   GLUCOSE 160* 11/30/2011  0500   BUN 22 11/30/2011 0500   CREATININE 0.70 11/30/2011 0500   CALCIUM 8.3* 11/30/2011 0500   GFRNONAA >90 11/30/2011 0500   GFRAA >90 11/30/2011 0500     5/16 UA: Hyaline casts  A: Ureteral stone  - Seen by urology. - F/u with urology as outpt.  Musculoskeletal: Lt knee aspiration 5/11>>neg  A: Joint pain, getting better  P: - Continue pain meds  Gastro-intestinal:  A: Constipation, Mouth ulcers, abnormal soft tissue swelling around abdominal aorta - Continue bowel regimen - Monitor oral exam  ID:  Antibiotics: Doxycycline 5/10>>5/11 Zosyn 5/10>>5/18 Vancomycin 5/10>>5/18 Micafungin 5/17>>>5/18  Cultures: Blood 5/10>>neg Urine 5/10>>neg Lt knee 5/11>>neg Sputum 5/14>>> neg   Lab 11/30/11 0500 11/29/11 0430 11/27/11 0313  WBC 24.9* 20.2* 19.7*   A: Leukocytosis- likely due to high dose steroids  P: -f/u VATS biopsy/cultures -keep CVL for possible IV Cytoxan -pcp bactim coverage  Endocrine: A: Steroid induced hyperglycemia P: - monitor CBG/SSI  A: Mild elevation in TSH P: - Will need further assessment as outpt.  BEST PRACTICE / DISPOSITION - Level of Care:  Will transfer to stepdown today - Primary Service: PCCM - Consultants: CVTS, ID - Code Status:  Full - Diet:  regular - DVT Px: Lovenox - GI Px:  protonix   To sdu   Mcarthur Rossetti. Tyson Alias, MD, FACP Pgr: [phone] [facility]

## 2011-11-30 NOTE — Progress Notes (Signed)
Pt ambulated around unit  Approx. 644ft. Tolerated well on 6L O2. Sats post ambvulation 91%. Robert Mcgee

## 2011-11-30 NOTE — Progress Notes (Signed)
Patient received via wheelchair from unit 2100. A/O, VSS, O2 sat 94% on 6L [place]. Denies discomfort. CHG bath completed. Family at bedside. Lurline Idol [facility]

## 2011-11-30 NOTE — Progress Notes (Addendum)
2 Days Post-Op Procedure(s) (LRB): VIDEO ASSISTED THORACOSCOPY (Left) LUNG BIOPSY (Left)  Subjective: Patient awake, alert, and without complaints.States he is breathing "ok". Has complaints of cough.  Objective: Vital signs in last 24 hours: Patient Vitals for the past 24 hrs:  BP Temp Temp src Pulse Resp SpO2 Weight  11/30/11 0805 - 98.1 F (36.7 C) Oral - - - -  11/30/11 0800 124/61 mmHg - - 58  16  91 % -  11/30/11 0700 123/63 mmHg - - 57  17  91 % -  11/30/11 0600 142/66 mmHg - - 65  22  92 % -  11/30/11 0500 145/70 mmHg - - 62  18  90 % -  11/30/11 0440 - - - 65  16  90 % -  11/30/11 0400 142/67 mmHg 97.6 F (36.4 C) Oral 67  22  92 % 208 lb 15.9 oz (94.8 kg)  11/30/11 0300 136/62 mmHg - - 65  15  89 % -  11/30/11 0200 140/61 mmHg - - 61  20  91 % -  11/30/11 0100 193/65 mmHg - - 65  23  92 % -  11/30/11 0000 148/72 mmHg 97.5 F (36.4 C) Oral 63  20  91 % -  11/29/11 2300 149/74 mmHg - - 62  20  92 % -  11/29/11 2200 142/68 mmHg - - 63  17  91 % -  11/29/11 2100 150/73 mmHg - - 65  20  91 % -  11/29/11 2000 127/67 mmHg 97.7 F (36.5 C) Oral 67  11  92 % -  11/29/11 1900 114/50 mmHg - - 84  26  90 % -  11/29/11 1800 115/64 mmHg - - - 21  92 % -  11/29/11 1700 135/66 mmHg - - - 21  92 % -  11/29/11 1600 139/68 mmHg 98.4 F (36.9 C) Oral - 24  92 % -  11/29/11 1500 132/61 mmHg - - - 20  92 % -  11/29/11 1400 132/58 mmHg - - - 19  92 % -  11/29/11 1300 148/74 mmHg - - - 20  93 % -  11/29/11 1200 141/69 mmHg 98.1 F (36.7 C) Oral - 20  94 % -  11/29/11 1100 151/75 mmHg - - 65  16  98 % -  11/29/11 1030 145/71 mmHg - - - - - -  11/29/11 1006 - - - 80  24  - -  11/29/11 1000 144/71 mmHg - - - - - -    Current Weight  11/30/11 208 lb 15.9 oz (94.8 kg)      Intake/Output from previous day: 05/18 0701 - 05/19 0700 In: 4223.7 [P.O.:920; I.V.:2768.7; IV Piggyback:535] Out: 1985 [Urine:1925; Chest Tube:60]   Physical Exam:  Cardiovascular: RRR. Pulmonary:  Diminished at left base;right lung is clear; no rales, wheezes, or rhonchi. Abdomen: Soft, non tender, bowel sounds present. Extremities: SCDs in place. Wounds: Dressing is clean and dry.   Lab Results: CBC:  Basename 11/30/11 0500 11/29/11 0430  WBC 24.9* 20.2*  HGB 11.0* 10.5*  HCT 33.6* 32.6*  PLT 807* 712*   BMET:   Basename 11/30/11 0500 11/29/11 0430  NA 140 137  K 4.1 4.5  CL 106 103  CO2 23 25  GLUCOSE 160* 156*  BUN 22 30*  CREATININE 0.70 0.79  CALCIUM 8.3* 8.4    PT/INR:   Basename 11/28/11 0342  LABPROT 14.6  INR 1.12   ABG:  INR: Will add  last result for INR, ABG once components are confirmed Will add last 4 CBG results once components are confirmed  Assessment/Plan:  Pulmonary-Encourage incentive spirometer.On 6 L via NCChest tube with minimal output. 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 WBC slightly increased, but is on high dose steroids. Remove central line. Heplock IV. Management per CCM et al.  Doree Fudge MPA-C 11/30/2011 9:12 AM   I have seen and examined the patient and agree with the assessment and plan as outlined.  D/C chest tube.  Otherwise per Pulm/CCM team.  Purcell Nails 11/30/2011 12:15 PM

## 2011-12-01 ENCOUNTER — Encounter (HOSPITAL_COMMUNITY): Payer: Self-pay | Admitting: Cardiothoracic Surgery

## 2011-12-01 ENCOUNTER — Inpatient Hospital Stay (HOSPITAL_COMMUNITY): Payer: Medicare Other

## 2011-12-01 LAB — URINALYSIS, ROUTINE W REFLEX MICROSCOPIC
Bilirubin Urine: NEGATIVE
Glucose, UA: NEGATIVE mg/dL
Ketones, ur: NEGATIVE mg/dL
Protein, ur: NEGATIVE mg/dL

## 2011-12-01 LAB — TYPE AND SCREEN
ABO/RH(D): A NEG
Antibody Screen: NEGATIVE
Unit division: 0
Unit division: 0

## 2011-12-01 LAB — CBC
MCH: 29.9 pg (ref 26.0–34.0)
MCV: 91.4 fL (ref 78.0–100.0)
Platelets: 735 10*3/uL — ABNORMAL HIGH (ref 150–400)
RDW: 16 % — ABNORMAL HIGH (ref 11.5–15.5)
WBC: 17.7 10*3/uL — ABNORMAL HIGH (ref 4.0–10.5)

## 2011-12-01 LAB — URINE MICROSCOPIC-ADD ON

## 2011-12-01 LAB — GLUCOSE, CAPILLARY
Glucose-Capillary: 137 mg/dL — ABNORMAL HIGH (ref 70–99)
Glucose-Capillary: 134 mg/dL — ABNORMAL HIGH (ref 70–99)
Glucose-Capillary: 170 mg/dL — ABNORMAL HIGH (ref 70–99)

## 2011-12-01 LAB — BASIC METABOLIC PANEL
Calcium: 8.6 mg/dL (ref 8.4–10.5)
Chloride: 101 mEq/L (ref 96–112)
Creatinine, Ser: 0.88 mg/dL (ref 0.50–1.35)
GFR calc Af Amer: >90 mL/min (ref >90)
Sodium: 137 mEq/L (ref 135–145)

## 2011-12-01 LAB — MAGNESIUM: Magnesium: 2.2 mg/dL (ref 1.5–2.5)

## 2011-12-01 LAB — ANCA TITERS: C-ANCA: 1:640 {titer} — ABNORMAL HIGH

## 2011-12-01 LAB — PHOSPHORUS: Phosphorus: 2.4 mg/dL (ref 2.3–4.6)

## 2011-12-01 MED ORDER — INSULIN ASPART 100 UNIT/ML ~~LOC~~ SOLN
0.0000 [IU] | Freq: Three times a day (TID) | SUBCUTANEOUS | Status: DC
  Administered 2011-12-02: 2 [IU] via SUBCUTANEOUS
  Administered 2011-12-02: 3 [IU] via SUBCUTANEOUS
  Administered 2011-12-03: 2 [IU] via SUBCUTANEOUS
  Administered 2011-12-03: 1 [IU] via SUBCUTANEOUS
  Administered 2011-12-03 – 2011-12-04 (×2): 2 [IU] via SUBCUTANEOUS
  Administered 2011-12-04: 5 [IU] via SUBCUTANEOUS
  Administered 2011-12-05: 2 [IU] via SUBCUTANEOUS
  Administered 2011-12-05: 1 [IU] via SUBCUTANEOUS
  Administered 2011-12-05 – 2011-12-06 (×2): 2 [IU] via SUBCUTANEOUS
  Administered 2011-12-07: 3 [IU] via SUBCUTANEOUS
  Administered 2011-12-07 (×2): 1 [IU] via SUBCUTANEOUS
  Administered 2011-12-08: 2 [IU] via SUBCUTANEOUS

## 2011-12-01 MED ORDER — INSULIN ASPART 100 UNIT/ML ~~LOC~~ SOLN
0.0000 [IU] | Freq: Every day | SUBCUTANEOUS | Status: DC
  Administered 2011-12-01: 2 [IU] via SUBCUTANEOUS

## 2011-12-01 MED ORDER — IOHEXOL 300 MG/ML  SOLN
100.0000 mL | Freq: Once | INTRAMUSCULAR | Status: AC | PRN
  Administered 2011-12-01: 100 mL via INTRAVENOUS

## 2011-12-01 MED ORDER — PREDNISONE 50 MG PO TABS
60.0000 mg | ORAL_TABLET | Freq: Every day | ORAL | Status: DC
  Administered 2011-12-02 – 2011-12-10 (×9): 60 mg via ORAL
  Filled 2011-12-01 (×11): qty 1

## 2011-12-01 MED ORDER — IOHEXOL 300 MG/ML  SOLN
20.0000 mL | INTRAMUSCULAR | Status: AC
  Administered 2011-12-01 (×2): 20 mL via ORAL

## 2011-12-01 NOTE — Progress Notes (Addendum)
3 Days Post-Op Procedure(s) (LRB): VIDEO ASSISTED THORACOSCOPY (Left) LUNG BIOPSY (Left)  Subjective:  Mr. Caselli remains short of breath this morning.  He remains on 6L of oxygen via nasal cannula.  He also has a productive cough with production of blood tinged sputum.  Objective: Vital signs in last 24 hours: Temp:  [97.3 F (36.3 C)-98.2 F (36.8 C)] 97.6 F (36.4 C) (05/20 0717) Pulse Rate:  [67-91] 88  (05/20 0715) Cardiac Rhythm:  [-] Normal sinus rhythm (05/20 0400) Resp:  [15-29] 19  (05/20 0715) BP: (113-151)/(52-95) 151/70 mmHg (05/20 0715) SpO2:  [91 %-100 %] 100 % (05/20 0715)  Intake/Output from previous day: 05/19 0701 - 05/20 0700 In: 640 [P.O.:390; I.V.:250] Out: 2120 [Urine:2100; Chest Tube:20] Intake/Output this shift: Total I/O In: -  Out: 325 [Urine:325]  General appearance: alert, cooperative and no distress Heart: regular rate and rhythm Lungs: diminished breath sounds LLL Abdomen: soft, non-tender; bowel sounds normal; no masses,  no organomegaly Wound: clean and dry  Lab Results:  Basename 12/01/11 0430 11/30/11 0500  WBC 17.7* 24.9*  HGB 11.1* 11.0*  HCT 33.9* 33.6*  PLT 735* 807*   BMET:  Basename 12/01/11 0430 11/30/11 0500  NA 137 140  K 3.8 4.1  CL 101 106  CO2 28 23  GLUCOSE 146* 160*  BUN 27* 22  CREATININE 0.88 0.70  CALCIUM 8.6 8.3*    PT/INR: No results found for this basename: LABPROT,INR in the last 72 hours ABG    Component Value Date/Time   PHART 7.420 11/29/2011 0451   HCO3 27.1* 11/29/2011 0451   TCO2 28 11/29/2011 0451   O2SAT 94.0 11/29/2011 0451   CBG (last 3)   Basename 12/01/11 0713 12/01/11 0335 12/01/11 0001  GLUCAP 134* 137* 125*    Assessment/Plan: S/P Procedure(s) (LRB): VIDEO ASSISTED THORACOSCOPY (Left) LUNG BIOPSY (Left)  2. Resp- patient on 6L via nasal cannula, + blood tinged sputum production, encouraged IS, patient may benefit from nebulizer treatments 3. Left Apical Pneumothorax- developed  S/P removal of left chest tube, will get repeat CXR in AM 4. Leukocytosis improved- on high dose steroids, no apparent source identified 5. Final Pathology pending 6. Management per primary team     LOS: 10 days    BARRETT, ERIN 12/01/2011  patient examined and medical record reviewed,agree with above note. VAN TRIGT III,Webber Michiels 12/01/2011

## 2011-12-01 NOTE — Op Note (Signed)
NAMEDIONTAE, ROUTE NO.:  [id number]  MEDICAL RECORD NO.:  [id number]  LOCATION:                                 FACILITY:  PHYSICIAN:  Kerin Perna, M.D.  DATE OF BIRTH:  [birth year]  DATE OF PROCEDURE:  11/28/2011 DATE OF DISCHARGE:                              OPERATIVE REPORT   OPERATION: 1. Left VATS, open lung biopsy of left upper lobe and left lower lobe. 2. Placement of wound ON-Q irrigation and analgesia catheter.  SURGEON:  Kerin Perna, MD.  ASSISTANT:  Doree Fudge, PA-C.  ANESTHESIA:  General.  PREOPERATIVE DIAGNOSES:  Acute respiratory insufficiency with bilateral pulmonary infiltrates and nodular densities.  POSTOPERATIVE DIAGNOSES:  Acute respiratory insufficiency with bilateral pulmonary infiltrates and nodular densities.  PROCEDURE:  The patient was brought directly from the Medical Intensive Care Unit to the operating room and was prepared for left VATS and lung biopsy.  I discussed the procedure in detail with the patient and his wife, and they understood the indications, benefits, alternatives, and expected postoperative recovery including expected postoperative mechanical ventilation.  The patient was brought to the operating room and general anesthesia was induced.  A double-lumen endotracheal tube was positioned by the anesthesiologist and the patient was positioned left side up.  A proper time-out was performed.  The left chest was prepped and draped as a sterile field.  A small incision for the camera port was made in the left 6th interspace just anterior to the tip of the scapula.  The lung was inspected and found to have no significant nodular or mass-like areas, but some old changes from smoking. Adhesions were minimal.  Next, a small incision was made anteriorly in the 5th interspace and the ribs were gently spread.  The lingula was grasped with a lung clamp and using the Endo-GIA stapling device, a lingular  left upper lobe biopsy was performed.  This was sent for frozen section as well as AFB fungal cultures.  Next, 2 biopsies of the left lower lobe were performed using the same technique using the Endo-GIA stapling device.  The staple lines were covered with ProGel medical adhesive and a 28-chest tube was placed and directed to the apex through a separate small incision.  The lung was inflated under direct vision.  A frozen section on the upper lobe returned no evidence of malignancy with an interstitial process to be determined.  The small anterior incision was closed with 2 pericostal of #2 Vicryl and the chest wall and skin were closed in layers using 0 Vicryl.  The port incision for the camera was closed with Vicryl.  An ON- Q catheter was positioned and secured to the skin and connected to a reservoir of 0.5% Marcaine.  Sterile dressings were applied.  The chest tube was connected to an underwater-seal Pleur-Evac drainage system.  The patient was then turned to exchange the double- lumen tube for single-lumen endotracheal tube and then was transferred to the Intensive Care Unit in stable condition.     Kerin Perna, M.D.     PV/MEDQ  D:  11/28/2011  T:  11/29/2011  Job:  [id number]  cc:  Felipa Evener, MD

## 2011-12-01 NOTE — Progress Notes (Signed)
PCCM Progress Note  HISTORY of PRESENT ILLNESS:  Robert Mcgee is a 71 y.o. male admitted on 11/21/2011 with fever, polyarthralgia, and found to have multiple pulmonary nodules.  PCCM consulted 5/13 to assess progressive b/l pulmonary infiltrates, dyspnea, and hypoxia.  PET/CT with diffuse uptake.  Started on high dose steroids and abx upon admission.   Tests/events 03/13/11 CT abd/pelvis>>8 mm RML nodule 11/17/11 CT chest>>borderline hilar/mediastinal adenopathy, b/l rounded nodules of varying size up to 2.6 cm 11/17/11 CT abd/pelvis>>abnormal soft tissue surrounding bifurcation of abdominal aorta 11/21/11 Scrotal u/s>>no evidence of testicular masses 11/24/11 PET scan>>numerous b/l pulmonary nodules with SUV up to 12.1, SUV 6.2 in abnormal soft tissue around abdominal aorta, SUV 6.1 Lt prostate   11/22/11 Echo>>mild LVH, EF 65 to 70%, grade 1 diastolic dysfx, mild AS, mild LA/RA dilation, PAS 42 mmHg 5/16: Tx to [facility] for VATS 5/17: VATS 5/18: Extubated, C- ANCA HIGH  Subjective/ Overnight:  Feeling a little better.  Remains SOB but slightly improved.  Temp:  [97.3 F (36.3 C)-98.2 F (36.8 C)] 97.6 F (36.4 C) (05/20 0717) Pulse Rate:  [67-91] 88  (05/20 0715) Cardiac Rhythm:  [-] Normal sinus rhythm (05/20 0715) Resp:  [15-29] 19  (05/20 0715) BP: (113-151)/(52-95) 151/70 mmHg (05/20 0715) SpO2:  [91 %-100 %] 100 % (05/20 0715) 5L [place]   I/O last 3 completed shifts: In: 2140 [P.O.:390; I.V.:1750] Out: 3115 [Urine:3075; Chest Tube:40]  Intake/Output Summary (Last 24 hours) at 12/01/11 0947 Last data filed at 12/01/11 0800  Gross per 24 hour  Intake      0 ml  Output   2245 ml  Net  -2245 ml     Physical exam: General - elderly male in no acute distress HEENT - PERRLA Chest - resps even non labored on 5L [place]  coarse, left CT out Cardiac - s1s2 regular, no murmur Abd - soft, nontender +bowel sounds Ext - scant BLE edema   Neuro - GCS 15, no focal deficits Psych -  normal mood, behavior Skin - no rashes  Dg Chest Port 1 View  11/30/2011  *RADIOLOGY REPORT*  Clinical Data: Post left-sided chest tube removal  PORTABLE CHEST - 1 VIEW  Comparison: 11/30/2011; 11/28/2011; 11/27/2011  Findings: Grossly unchanged cardiac silhouette and mediastinal contours.  Interval development of a tiny left apical pneumothorax post left-sided chest tube removal.  Grossly unchanged positioning of a right jugular approach central venous catheter with tip overlying the superior cavoatrial junction.  Overall improved aeration of the lungs with persistent bilateral mid and upper lung heterogeneous air space opacities.  No new focal airspace opacities.  No definite pleural effusion.  Unchanged bones.  IMPRESSION: 1.  Interval development of a tiny left apical pneumothorax post left-sided chest tube removal. 2.  Overall improved inspiration with persistent bilateral mid and upper lung predominant heterogeneous opacities worrisome for multifocal infection.  Original Report Authenticated By: Waynard Reeds, M.D.   Dg Chest Port 1 View  11/30/2011  *RADIOLOGY REPORT*  Clinical Data: Left-sided chest tube evaluate pulmonary infiltrates  PORTABLE CHEST - 1 VIEW  Comparison: 11/29/2011; 11/28/2011; 11/27/2011  Findings:  Grossly unchanged cardiac silhouette and mediastinal contours. Interval extubation and removal of enteric tube.  Stable positioning of remaining support apparatus.  No definite pneumothorax.  Overall slight worsening of bilateral mid and upper lung predominant heterogeneous air space opacities.  Lung volumes remain persistently reduced.  No definite pleural effusion. Grossly unchanged bones.  IMPRESSION: 1.  Interval extubation  and removal of enteric tube.  Stable position of remaining support apparatus.  No pneumothorax. 2.  Overall slight worsening of bilateral mid and upper lung predominant airspace opacities worrisome for progression of multifocal infection.  Original Report  Authenticated By: Waynard Reeds, M.D.    CBC    Component Value Date/Time   WBC 17.7* 12/01/2011 0430   RBC 3.71* 12/01/2011 0430   HGB 11.1* 12/01/2011 0430   HCT 33.9* 12/01/2011 0430   PLT 735* 12/01/2011 0430   MCV 91.4 12/01/2011 0430   MCH 29.9 12/01/2011 0430   MCHC 32.7 12/01/2011 0430   RDW 16.0* 12/01/2011 0430   LYMPHSABS 1.3 11/30/2011 0500   MONOABS 1.3* 11/30/2011 0500   EOSABS 0.0 11/30/2011 0500   BASOSABS 0.0 11/30/2011 0500    BMET    Component Value Date/Time   NA 137 12/01/2011 0430   K 3.8 12/01/2011 0430   CL 101 12/01/2011 0430   CO2 28 12/01/2011 0430   GLUCOSE 146* 12/01/2011 0430   BUN 27* 12/01/2011 0430   CREATININE 0.88 12/01/2011 0430   CALCIUM 8.6 12/01/2011 0430   GFRNONAA 84* 12/01/2011 0430   GFRAA >90 12/01/2011 0430    ASSESSMENT/PLAN:    Pulmonary: ANCA>>>POS  1: 640   ANA>>>neg CEA>>>neg RF>>>18 RMSF>>>neg  A: Acute hypoxic respiratory failure with progressive b/l pulmonary infiltrates with more peripheral distribution.  S/p VATS 5/17.  This has been associated with symmetric b/l lower extremity polyarthralgia.  He required prednisone therapy to treat sinus infection in Fall 2012. More concerned about inflammatory process (more likely) vs infectious process.  Malignancy seems less likely given that he had a normal chest xray in April 2013, and has since developed rapidly progressive airspace disease.   His ESR was 69 on admission.  CPK, uric acid, LDH normal.  He had mild elevation in TSH (6.679).  ANA, ACE, RF, Hep A/B/C, Cryptococcal Ag all negative.  B burgdorferi Ab IgG + IgM 1.41.  Histoplasma Ab, RMSF IgM/IgG pending.  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   Plan: -will pulse solumedrol 1 gram x 3 (5/18, 5/19, 5/20) days then will determine further steroids and cytoxan course , await protease 3 and path - F/u CXR in  AM. -keep negative fluid balance  -continue supplemental oxygen, sats >92%  -Pulmonary hygiene  -add cyclophosphamide when final path results back, and if no additional interventions needed for ?abdominal mass   Cardiac: ECHO: EF 65%, grade I diastolic dysfunction and PAP of 42.  A: Hypertension-resolved P: - Monitor    Renal:  5/16 UA: Hyaline casts  A: Ureteral stone  - Seen by urology. - F/u with urology as outpt.  Musculoskeletal: Lt knee aspiration 5/11>>neg  A: Joint pain, getting better  P: - Continue pain meds   Gastro-intestinal:  A: Constipation, Mouth ulcers, abnormal soft tissue swelling around abdominal aorta - Continue bowel regimen - Monitor oral exam -f/u CT abd/pelvis with with IV and oral contrast  ID:  Antibiotics: Doxycycline 5/10>>5/11 Zosyn 5/10>>5/18 Vancomycin 5/10>>5/18 Micafungin 5/17>>>5/18  Cultures: Blood 5/10>>neg Urine 5/10>>neg Lt knee 5/11>>neg Sputum 5/14>>> neg   Lab 12/01/11 0430 11/30/11 0500 11/29/11 0430  WBC 17.7* 24.9* 20.2*   A: Leukocytosis- likely due to high dose steroids.  Trending down.  P: -f/u VATS biopsy/cultures -keep CVL for possible IV Cytoxan -pcp bactim coverage  Endocrine: A: Steroid induced hyperglycemia  P: - monitor CBG/SSI  A: Mild elevation in TSH P: - Will need further assessment as outpt.  BEST PRACTICE / DISPOSITION - Level of Care:  SDU  - Primary Service: PCCM - Consultants: CVTS, ID - Code Status:  Full - Diet:  regular - DVT Px: Lovenox - GI Px:  protonix    WHITEHEART,KATHRYN, NP 12/01/2011  9:51 AM Pager: [phone]  *Care during the described time interval was provided by me and/or other providers on the critical care team. I have reviewed this patient's available data, including medical history, events of note, physical examination and test results as part of my evaluation.   Coralyn Helling, MD 12/01/2011, 11:34 AM Pager:  [phone]

## 2011-12-02 ENCOUNTER — Inpatient Hospital Stay (HOSPITAL_COMMUNITY): Payer: Medicare Other

## 2011-12-02 LAB — GLUCOSE, CAPILLARY
Glucose-Capillary: 150 mg/dL — ABNORMAL HIGH (ref 70–99)
Glucose-Capillary: 157 mg/dL — ABNORMAL HIGH (ref 70–99)

## 2011-12-02 LAB — TISSUE CULTURE
Culture: NO GROWTH
Culture: NO GROWTH

## 2011-12-02 LAB — MISCELLANEOUS TEST

## 2011-12-02 LAB — C4 COMPLEMENT: Complement C4, Body Fluid: 5 mg/dL — ABNORMAL LOW (ref 10–40)

## 2011-12-02 NOTE — Progress Notes (Signed)
PCCM Progress Note  HISTORY of PRESENT ILLNESS:  Robert Mcgee is a 71 y.o. male admitted on 11/21/2011 with fever, polyarthralgia, and found to have multiple pulmonary nodules.  PCCM consulted 5/13 to assess progressive b/l pulmonary infiltrates, dyspnea, and hypoxia.  PET/CT with diffuse uptake.  Started on high dose steroids and abx upon admission.   Tests/events 03/13/11 CT abd/pelvis>>8 mm RML nodule 11/17/11 CT chest>>borderline hilar/mediastinal adenopathy, b/l rounded nodules of varying size up to 2.6 cm 11/17/11 CT abd/pelvis>>abnormal soft tissue surrounding bifurcation of abdominal aorta 11/21/11 Scrotal u/s>>no evidence of testicular masses 11/24/11 PET scan>>numerous b/l pulmonary nodules with SUV up to 12.1, SUV 6.2 in abnormal soft tissue around abdominal aorta, SUV 6.1 Lt prostate   11/22/11 Echo>>mild LVH, EF 65 to 70%, grade 1 diastolic dysfx, mild AS, mild LA/RA dilation, PAS 42 mmHg 5/16: Tx to [facility] for VATS 5/17: VATS 5/18: Extubated, C- ANCA HIGH  Subjective/ Overnight:  Feeling a little better.  Remains SOB but slightly improved.  Temp:  [97.1 F (36.2 C)-98.5 F (36.9 C)] 97.9 F (36.6 C) (05/21 1227) Pulse Rate:  [65-103] 75  (05/21 1227) Cardiac Rhythm:  [-] Normal sinus rhythm (05/21 1227) Resp:  [16-25] 25  (05/21 1227) BP: (135-187)/(61-79) 151/75 mmHg (05/21 1227) SpO2:  [89 %-97 %] 92 % (05/21 1227) FiO2 (%):  [2 %] 2 % (05/21 1227) 2L [place]   I/O last 3 completed shifts: In: 770 [P.O.:770] Out: 3725 [Urine:3725]  Intake/Output Summary (Last 24 hours) at 12/02/11 1350 Last data filed at 12/02/11 1227  Gross per 24 hour  Intake   1250 ml  Output   2475 ml  Net  -1225 ml     Physical exam: General - elderly male in no acute distress HEENT - PERRLA Chest - resps even non labored on 2L [place]  coarse Cardiac - s1s2 regular, no murmur Abd - soft, nontender +bowel sounds Ext - scant BLE edema   Neuro - GCS 15, no focal deficits Psych - normal  mood, behavior Skin - no rashes  Ct Abdomen Pelvis W Contrast  12/01/2011  *RADIOLOGY REPORT*  Clinical Data: Patient recently extubated.  Follow up periaortic mass.  CT ABDOMEN AND PELVIS WITH CONTRAST  Technique:  Multidetector CT imaging of the abdomen and pelvis was performed following the standard protocol during bolus administration of intravenous contrast.  Contrast: OMNIPAQUE IOHEXOL 300 MG/ML.  Oral contrast was also administered.  Comparison: PET CT 11/24/2010.  CT abdomen pelvis 11/18/2011.  Findings: Mass in the left common iliac region measuring approximately 2.9 x 5.0 cm (series 2, image 68), perhaps minimally decreased in size since the examination 2 weeks ago where it measured approximately 3.3 x 5.5 cm.  The mass begins just below the aortic bifurcation and extends to just below the left common iliac bifurcation.  Scattered normal-sized lymph nodes elsewhere in the retroperitoneum of the abdomen, unchanged.  No new lymphadenopathy in the abdomen or pelvis.  Heterogeneous enhancement of the spleen, not apparent on the prior examination.  (The entire spleen was hypermetabolic on the prior PET CT).  Normal appearing liver, pancreas, adrenal glands, and right kidney.  Small cyst arising from the upper pole of the left kidney which is otherwise normal in appearance.  Gallbladder unremarkable by CT. No biliary ductal dilation.  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  Patent visceral arteries.  Note again made of the circumaortic left renal vein.  Stomach decompressed unremarkable by CT.  Small diverticulum arising  from the medial aspect of the descending duodenum.  Small bowel otherwise unremarkable.  Distal descending and sigmoid colon diverticulosis without evidence of acute diverticulitis.  Remainder of the colon unremarkable.  Normal appendix in the right mid abdomen and right upper pelvis.  No ascites.  Moderate median lobe prostate gland enlargement.  Gas bubble  within the urinary bladder presumably from recent instrumentation. Urinary bladder otherwise unremarkable.  Stable non-obstructing approximate 7 mm calculus at the left UVJ.  Interval slight improvement in the airspace consolidation in the right middle lobe, lingula, and both lower lobes since the head CT 1 week ago, though dense airspace consolidation persists in the posterior right lower lobe, with patchy airspace opacities elsewhere.  Interval surgery in the lingula with suture material noted.  No new abnormalities in the visualized lung bases.  IMPRESSION:  1.  Perhaps slight decrease in size of the presumed nodal mass in the left common iliac region since the CT 2 weeks ago, measured above. No new or enlarging lymphadenopathy in the abdomen or pelvis. 2.  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 3.  Interval improvement in the airspace consolidation in the right middle lobe, lingula, and both lower lobes since the PET CT 1 week ago, though dense consolidation persists in the posterior lower lobes, right greater than left, and patchy airspace opacities are present elsewhere.  Were there lymphocytes in the recent biopsy to suggest pulmonary lymphoma? 4.  Descending and sigmoid colon diverticulosis without evidence of acute diverticulitis. 5.  Stable non-obstructing approximate 7 mm left UVJ calculus. 6.  Stable median lobe prostate gland enlargement. 7.  Stable small diverticulum arising from the descending duodenum.  Original Report Authenticated By: Arnell Sieving, M.D.   Dg Chest Port 1 View  12/02/2011  *RADIOLOGY REPORT*  Clinical Data: Dyspnea, left apical pneumothorax  PORTABLE CHEST - 1 VIEW  Comparison: Portable exam 0625 hours compared to 11/30/2011  Findings: Right jugular central venous catheter, tip projecting over SVC. Stable  heart size and mediastinal contours. Patchy bilateral airspace infiltrates primarily in the mid-to-lower lungs, unchanged. No definite pneumothorax visualized. Bones demineralized.  IMPRESSION: Persistent bilateral patchy airspace infiltrates. No definite pneumothorax visualized.  Original Report Authenticated By: Lollie Marrow, M.D.   Dg Chest Port 1 View  11/30/2011  *RADIOLOGY REPORT*  Clinical Data: Post left-sided chest tube removal  PORTABLE CHEST - 1 VIEW  Comparison: 11/30/2011; 11/28/2011; 11/27/2011  Findings: Grossly unchanged cardiac silhouette and mediastinal contours.  Interval development of a tiny left apical pneumothorax post left-sided chest tube removal.  Grossly unchanged positioning of a right jugular approach central venous catheter with tip overlying the superior cavoatrial junction.  Overall improved aeration of the lungs with persistent bilateral mid and upper lung heterogeneous air space opacities.  No new focal airspace opacities.  No definite pleural effusion.  Unchanged bones.  IMPRESSION: 1.  Interval development of a tiny left apical pneumothorax post left-sided chest tube removal. 2.  Overall improved inspiration with persistent bilateral mid and upper lung predominant heterogeneous opacities worrisome for multifocal infection.  Original Report Authenticated By: Waynard Reeds, M.D.    CBC    Component Value Date/Time   WBC 17.7* 12/01/2011 0430   RBC 3.71* 12/01/2011 0430   HGB 11.1* 12/01/2011 0430   HCT 33.9* 12/01/2011 0430   PLT 735* 12/01/2011 0430  MCV 91.4 12/01/2011 0430   MCH 29.9 12/01/2011 0430   MCHC 32.7 12/01/2011 0430   RDW 16.0* 12/01/2011 0430   LYMPHSABS 1.3 11/30/2011 0500   MONOABS 1.3* 11/30/2011 0500   EOSABS 0.0 11/30/2011 0500   BASOSABS 0.0 11/30/2011 0500    BMET    Component Value Date/Time   NA 137 12/01/2011 0430   K 3.8 12/01/2011 0430   CL 101 12/01/2011 0430   CO2 28 12/01/2011 0430   GLUCOSE 146* 12/01/2011 0430   BUN 27* 12/01/2011  0430   CREATININE 0.88 12/01/2011 0430   CALCIUM 8.6 12/01/2011 0430   GFRNONAA 84* 12/01/2011 0430   GFRAA >90 12/01/2011 0430    ASSESSMENT/PLAN:    Pulmonary: ANCA>>>POS  1: 640   ANA>>>neg CEA>>>neg RF>>>18 RMSF>>>neg  A: Acute hypoxic respiratory failure with progressive b/l pulmonary infiltrates with more peripheral distribution.  S/p VATS 5/17.  This has been associated with symmetric b/l lower extremity polyarthralgia.  He required prednisone therapy to treat sinus infection in Fall 2012. More concerned about inflammatory process (more likely) vs infectious process.  Malignancy seems less likely given that he had a normal chest xray in April 2013, and has since developed rapidly progressive airspace disease.   His ESR was 69 on admission.  CPK, uric acid, LDH normal.  He had mild elevation in TSH (6.679).  ANA, ACE, RF, Hep A/B/C, Cryptococcal Ag all negative.  B burgdorferi Ab IgG + IgM 1.41.  Histoplasma Ab, RMSF IgM/IgG pending.  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  A: Hypertension-resolved P: - Monitor    Renal:  5/16 UA: Hyaline casts 5/20 UA: Moderate leukocytes, large Hb  A: Ureteral stone  - Seen by urology. - F/u with urology as outpt.  Musculoskeletal: Lt knee aspiration 5/11>>neg  A: Joint pain, getting better  P: - Continue pain meds   Gastro-intestinal:  A: Constipation, Mouth ulcers, abnormal soft tissue swelling around abdominal aorta -Continue bowel regimen -Monitor oral exam -?if he needs bx of abdominal mass  ID:  Antibiotics: Doxycycline 5/10>>5/11 Zosyn 5/10>>5/18 Vancomycin  5/10>>5/18 Micafungin 5/17>>>5/18  Cultures: Blood 5/10>>neg Urine 5/10>>neg Lt knee 5/11>>neg Sputum 5/14>>> neg   Lab 12/01/11 0430 11/30/11 0500 11/29/11 0430  WBC 17.7* 24.9* 20.2*   A: Leukocytosis- likely due to high dose steroids.  Trending down.  P: -f/u VATS biopsy/cultures -pcp bactim coverage  Endocrine: A: Steroid induced hyperglycemia P: - monitor CBG/SSI  A: Mild elevation in TSH P: - Will need further assessment as outpt.  BEST PRACTICE / DISPOSITION - Level of Care:  SDU  - Primary Service: PCCM - Consultants: CVTS, ID - Code Status:  Full - Diet:  regular - DVT Px: Lovenox - GI Px:  protonix    Coralyn Helling, MD 12/02/2011, 1:50 PM Pager:  [phone]

## 2011-12-02 NOTE — Progress Notes (Addendum)
4 Days Post-Op Procedure(s) (LRB): VIDEO ASSISTED THORACOSCOPY (Left) LUNG BIOPSY (Left)  Subjective: Patient states he is breathing "ok". He does have a productive cough (clear sputum, not bloody).  Objective: Vital signs in last 24 hours: Patient Vitals for the past 24 hrs:  BP Temp Temp src Pulse Resp SpO2  12/02/11 0718 187/78 mmHg - Oral 88  21  91 %  12/02/11 0345 142/68 mmHg 98 F (36.7 C) Oral 67  18  89 %  12/02/11 0000 141/71 mmHg 98.5 F (36.9 C) Oral 65  17  94 %  12/01/11 2018 - 97.1 F (36.2 C) Oral - - -  12/01/11 2015 135/61 mmHg - - 66  22  91 %  12/01/11 1600 - 98.1 F (36.7 C) - - - -  12/01/11 1555 153/79 mmHg - - 68  16  97 %  12/01/11 1210 151/74 mmHg 97.6 F (36.4 C) Oral 74  22  92 %    Current Weight  11/30/11 208 lb 15.9 oz (94.8 kg)      Intake/Output from previous day: 05/20 0701 - 05/21 0700 In: 770 [P.O.:770] Out: 2425 [Urine:2425]   Physical Exam:  Cardiovascular: RRR. Pulmonary: Diminished at bases; no rales, wheezes, or rhonchi. Abdomen: Soft, non tender, bowel sounds present. Extremities: SCDs in place. Wounds: Clean and dry.  Lab Results: CBC:  Basename 12/01/11 0430 11/30/11 0500  WBC 17.7* 24.9*  HGB 11.1* 11.0*  HCT 33.9* 33.6*  PLT 735* 807*   BMET:   Basename 12/01/11 0430 11/30/11 0500  NA 137 140  K 3.8 4.1  CL 101 106  CO2 28 23  GLUCOSE 146* 160*  BUN 27* 22  CREATININE 0.88 0.70  CALCIUM 8.6 8.3*    PT/INR:  No results found for this basename: LABPROT,INR in the last 72 hours ABG:  INR: Will add last result for INR, ABG once components are confirmed Will add last 4 CBG results once components are confirmed  Assessment/Plan:  1.Pulmonary-Encourage incentive spirometer.On 6 L via [place].CXR this am shows extensive bilateral pulmonary opacities and questionable trace left apical pneumothorax .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 2.Management per CCM et al.  Doree Fudge  MPA-C 12/02/2011 7:46 AM      patient examined and medical record reviewed,agree with above note. VAN TRIGT III,Tory Septer 12/02/2011

## 2011-12-02 NOTE — Care Management Note (Signed)
Page 2 of 2   12/10/2011     12:01:11 PM   CARE MANAGEMENT NOTE 12/10/2011  Patient:  Robert Mcgee, Robert Mcgee   Account Number:  [id number]  Date Initiated:  11/24/2011  Documentation initiated by:  [name]  Subjective/Objective Assessment:   ADMITTED WITH GENERALIZED WEAKNESS     Action/Plan:   LIVES AT HOME WITH SPOUSE; INSEPENDENT PRIOR TO ADMISSION   Anticipated DC Date:  12/10/2011   Anticipated DC Plan:  HOME W HOME HEALTH SERVICES      DC Planning Services  CM consult      Choice offered to / List presented to:             Status of service:  Completed, signed off Medicare Important Message given?   (If response is "NO", the following Medicare IM given date fields will be blank) Date Medicare IM given:   Date Additional Medicare IM given:    Discharge Disposition:  HOME W HOME HEALTH SERVICES  Per UR Regulation:  Reviewed for med. necessity/level of care/duration of stay  If discussed at Long Length of Stay Meetings, dates discussed:   12/03/2011    Comments:   12-10-11 Patient stayed  overnight due to starting a new medication and starting to check CBG's .  Received actual order for HHPT and completed face to face.  Faxed to [facility] , called Okey Regal at [facility] to confirm.  Start date for HHPT is 12-11-11 .  Okey Regal at [facility] phone number [phone] and fax [phone].  Also received order for CBG machine for home . Called patient's cell phone [phone] and spoke to patient's wife. She plans on buying CBG machine at [facility] . [name] RN BSN [phone]     12-09-11 Spoke with patient and his wife. PT recommending HHPT .  [name] from PT stated patient does not need rolling walker or 3 in 1 .  Patient agrees he does not need rolling walker or 3 in 1 . Patient unsure if he wants HHPT .  Explained  of it was his choice and maybe he might benefit from a safety eval. Patient stated he will "think about it ".  Patient lives here [place] ( 2 story home ) , however,  also has a house in [place] .  Patient is discharging to his home in [facility] ( one level) . Gave patient and wife list of home health agencies for [place] and [facility] .  Patient waiting on MD to make rounds .  Will check back on patient to see what he decides . [name] RN BSN [phone]    12/03/11- 1030- [name] RN, BSN [phone] Spoke with pt and daughter in law at bedside- per conversation pt states that he plans to return home with spouse, family will be able to provide support at home. Pt is interested in having a RW for home, and there is a possible need for home O2. Per PT/OT notes pt may benefit from [facility] -PT/OT at home and pt is open to this. will need orders from MD for [facility] and DME prior to discharge. Pt will have transportation home, and has prescription benefits. NCM to continue to follow for d/c planning and needs.  12/02/11- 1045- [name] RN, BSN [phone] Pt 4 days post op VATS, still requiring 6L O2 via [place],  UR review completed, NCM to continue to follow for d/c planning/needs.   [id number] [name], RN, BSN, CCM patient still requiring bipap at night  and 5l o2 to keep o2 above 92% No discharge needs present at time of this review at the sdu/icu level. Case Management [phone]  11/24/2011- [name] RN, BSN, MHA

## 2011-12-02 NOTE — Progress Notes (Signed)
Utilization review completed.  

## 2011-12-03 ENCOUNTER — Inpatient Hospital Stay (HOSPITAL_COMMUNITY): Payer: Medicare Other

## 2011-12-03 LAB — URINALYSIS, ROUTINE W REFLEX MICROSCOPIC
Bilirubin Urine: NEGATIVE
Ketones, ur: NEGATIVE mg/dL
Nitrite: NEGATIVE
Urobilinogen, UA: 2 mg/dL — ABNORMAL HIGH (ref 0.0–1.0)
pH: 6.5 (ref 5.0–8.0)

## 2011-12-03 LAB — GLUCOSE, CAPILLARY

## 2011-12-03 LAB — CBC
HCT: 34.8 % — ABNORMAL LOW (ref 39.0–52.0)
Hemoglobin: 11.7 g/dL — ABNORMAL LOW (ref 13.0–17.0)
MCH: 30.4 pg (ref 26.0–34.0)
MCHC: 33.6 g/dL (ref 30.0–36.0)
RBC: 3.85 MIL/uL — ABNORMAL LOW (ref 4.22–5.81)

## 2011-12-03 LAB — URINE MICROSCOPIC-ADD ON

## 2011-12-03 LAB — BASIC METABOLIC PANEL
BUN: 19 mg/dL (ref 6–23)
CO2: 27 mEq/L (ref 19–32)
Chloride: 101 mEq/L (ref 96–112)
Glucose, Bld: 107 mg/dL — ABNORMAL HIGH (ref 70–99)
Potassium: 3.6 mEq/L (ref 3.5–5.1)
Sodium: 136 mEq/L (ref 135–145)

## 2011-12-03 LAB — EXPECTORATED SPUTUM ASSESSMENT W GRAM STAIN, RFLX TO RESP C

## 2011-12-03 MED ORDER — MENTHOL 3 MG MT LOZG
1.0000 | LOZENGE | OROMUCOSAL | Status: DC | PRN
  Filled 2011-12-03 (×3): qty 9

## 2011-12-03 NOTE — Progress Notes (Signed)
PCCM Progress Note  HISTORY of PRESENT ILLNESS:  Robert Mcgee is a 71 y.o. male admitted on 11/21/2011 with fever, polyarthralgia, and found to have multiple pulmonary nodules.  PCCM consulted 5/13 to assess progressive b/l pulmonary infiltrates, dyspnea, and hypoxia.  PET/CT with diffuse uptake.  Started on high dose steroids and abx upon admission.   Tests/events 03/13/11 CT abd/pelvis>>8 mm RML nodule 11/17/11 CT chest>>borderline hilar/mediastinal adenopathy, b/l rounded nodules of varying size up to 2.6 cm 11/17/11 CT abd/pelvis>>abnormal soft tissue surrounding bifurcation of abdominal aorta 11/21/11 Scrotal u/s>>no evidence of testicular masses 11/24/11 PET scan>>numerous b/l pulmonary nodules with SUV up to 12.1, SUV 6.2 in abnormal soft tissue around abdominal aorta, SUV 6.1 Lt prostate   11/22/11 Echo>>mild LVH, EF 65 to 70%, grade 1 diastolic dysfx, mild AS, mild LA/RA dilation, PAS 42 mmHg 5/16: Tx to [facility] for VATS 5/17: VATS 5/18: Extubated, C- ANCA HIGH  Subjective/ Overnight:  Feeling a bit better.  Still has some prod cough but denies hemoptysis.  Febrile overnight.   Temp:  [97.7 F (36.5 C)-101.3 F (38.5 C)] 101.3 F (38.5 C) (05/22 0715) Pulse Rate:  [75-105] 105  (05/22 0715) Cardiac Rhythm:  [-] Normal sinus rhythm (05/22 0715) Resp:  [19-25] 20  (05/22 0715) BP: (135-159)/(67-81) 135/81 mmHg (05/22 0715) SpO2:  [90 %-95 %] 93 % (05/22 0715) FiO2 (%):  [2 %] 2 % (05/21 1227) 3L [place]   I/O last 3 completed shifts: In: 1320 [P.O.:1320] Out: 3450 [Urine:3450]  Intake/Output Summary (Last 24 hours) at 12/03/11 0940 Last data filed at 12/03/11 1478  Gross per 24 hour  Intake   1440 ml  Output   2525 ml  Net  -1085 ml     Physical exam: General - elderly male in no acute distress HEENT - PERRLA Chest - resps even non labored on 3L [place], diminished bases, few scattered crackles  Cardiac - s1s2 regular, no murmur Abd - soft, nontender +bowel  sounds Ext - scant BLE edema   Neuro - GCS 15, no focal deficits Psych - normal mood, behavior Skin - no rashes  Ct Abdomen Pelvis W Contrast  12/01/2011  *RADIOLOGY REPORT*  Clinical Data: Patient recently extubated.  Follow up periaortic mass.  CT ABDOMEN AND PELVIS WITH CONTRAST  Technique:  Multidetector CT imaging of the abdomen and pelvis was performed following the standard protocol during bolus administration of intravenous contrast.  Contrast: OMNIPAQUE IOHEXOL 300 MG/ML.  Oral contrast was also administered.  Comparison: PET CT 11/24/2010.  CT abdomen pelvis 11/18/2011.  Findings: Mass in the left common iliac region measuring approximately 2.9 x 5.0 cm (series 2, image 68), perhaps minimally decreased in size since the examination 2 weeks ago where it measured approximately 3.3 x 5.5 cm.  The mass begins just below the aortic bifurcation and extends to just below the left common iliac bifurcation.  Scattered normal-sized lymph nodes elsewhere in the retroperitoneum of the abdomen, unchanged.  No new lymphadenopathy in the abdomen or pelvis.  Heterogeneous enhancement of the spleen, not apparent on the prior examination.  (The entire spleen was hypermetabolic on the prior PET CT).  Normal appearing liver, pancreas, adrenal glands, and right kidney.  Small cyst arising from the upper pole of the left kidney which is otherwise normal in appearance.  Gallbladder unremarkable by CT. No biliary ductal dilation.  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  Patent visceral arteries.  Note again made of the circumaortic left renal  vein.  Stomach decompressed unremarkable by CT.  Small diverticulum arising from the medial aspect of the descending duodenum.  Small bowel otherwise unremarkable.  Distal descending and sigmoid colon diverticulosis without evidence of acute diverticulitis.  Remainder of the colon unremarkable.  Normal appendix in the right mid abdomen and right upper  pelvis.  No ascites.  Moderate median lobe prostate gland enlargement.  Gas bubble within the urinary bladder presumably from recent instrumentation. Urinary bladder otherwise unremarkable.  Stable non-obstructing approximate 7 mm calculus at the left UVJ.  Interval slight improvement in the airspace consolidation in the right middle lobe, lingula, and both lower lobes since the head CT 1 week ago, though dense airspace consolidation persists in the posterior right lower lobe, with patchy airspace opacities elsewhere.  Interval surgery in the lingula with suture material noted.  No new abnormalities in the visualized lung bases.  IMPRESSION:  1.  Perhaps slight decrease in size of the presumed nodal mass in the left common iliac region since the CT 2 weeks ago, measured above. No new or enlarging lymphadenopathy in the abdomen or pelvis. 2.  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 3.  Interval improvement in the airspace consolidation in the right middle lobe, lingula, and both lower lobes since the PET CT 1 week ago, though dense consolidation persists in the posterior lower lobes, right greater than left, and patchy airspace opacities are present elsewhere.  Were there lymphocytes in the recent biopsy to suggest pulmonary lymphoma? 4.  Descending and sigmoid colon diverticulosis without evidence of acute diverticulitis. 5.  Stable non-obstructing approximate 7 mm left UVJ calculus. 6.  Stable median lobe prostate gland enlargement. 7.  Stable small diverticulum arising from the descending duodenum.  Original Report Authenticated By: Arnell Sieving, M.D.   Dg Chest Port 1 View  12/02/2011  *RADIOLOGY REPORT*  Clinical Data: Dyspnea, left apical pneumothorax  PORTABLE CHEST - 1 VIEW  Comparison: Portable exam 0625 hours compared to 11/30/2011   Findings: Right jugular central venous catheter, tip projecting over SVC. Stable heart size and mediastinal contours. Patchy bilateral airspace infiltrates primarily in the mid-to-lower lungs, unchanged. No definite pneumothorax visualized. Bones demineralized.  IMPRESSION: Persistent bilateral patchy airspace infiltrates. No definite pneumothorax visualized.  Original Report Authenticated By: Lollie Marrow, M.D.    CBC    Component Value Date/Time   WBC 16.0* 12/03/2011 0406   RBC 3.85* 12/03/2011 0406   HGB 11.7* 12/03/2011 0406   HCT 34.8* 12/03/2011 0406   PLT 727* 12/03/2011 0406   MCV 90.4 12/03/2011 0406   MCH 30.4 12/03/2011 0406   MCHC 33.6 12/03/2011 0406   RDW 16.6* 12/03/2011 0406   LYMPHSABS 1.3 11/30/2011 0500   MONOABS 1.3* 11/30/2011 0500   EOSABS 0.0 11/30/2011 0500   BASOSABS 0.0 11/30/2011 0500    BMET    Component Value Date/Time   NA 136 12/03/2011 0406   K 3.6 12/03/2011 0406   CL 101 12/03/2011 0406   CO2 27 12/03/2011 0406   GLUCOSE 107* 12/03/2011 0406   BUN 19 12/03/2011 0406   CREATININE 0.81 12/03/2011 0406   CALCIUM 8.6 12/03/2011 0406   GFRNONAA 87* 12/03/2011 0406   GFRAA >90 12/03/2011 0406    ASSESSMENT/PLAN:   Pulmonary: ANCA>>>POS  1: 640   ANA>>>neg CEA>>>neg RF>>>18 RMSF>>>neg  A: Acute hypoxic respiratory failure with  progressive b/l pulmonary infiltrates with more peripheral distribution.  S/p VATS 5/17.  This has been associated with symmetric b/l lower extremity polyarthralgia.  He required prednisone therapy to treat sinus infection in Fall 2012. More concerned about inflammatory process (more likely) vs infectious process.     His ESR was 69 on admission.  CPK, uric acid, LDH normal.  He had mild elevation in TSH (6.679).  ANA, ACE, RF, Hep A/B/C, Cryptococcal Ag all negative.  B burgdorferi Ab IgG + IgM 1.41.  Histoplasma Ab, RMSF IgM/IgG pending.  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   -cont BD -aggressive pulm hygiene   Cardiac: ECHO: EF 65%, grade I diastolic dysfunction and PAP of 42.  A: Hypertension-resolved P: - Monitor    Renal:  5/16 UA: Hyaline casts 5/20 UA: Moderate leukocytes, large Hb  A: Ureteral stone  - Seen by urology. - F/u with urology as outpt.  Musculoskeletal: Lt knee aspiration 5/11>>neg  A: Joint pain, getting better  P: - Continue pain meds -pt/ot  Gastro-intestinal:  A: Constipation, Mouth ulcers, abnormal soft tissue swelling around abdominal aorta on CT and ?infiltrative process throughout spleen.  ?lymphoma.  -Continue bowel regimen -Monitor oral exam -?if he needs bx of abdominal mass > appears to be smaller on f/u CT scan after pred, can likely defer for now but he will need f/u imaging in a few months  ID:  Antibiotics: Doxycycline 5/10>>5/11 Zosyn 5/10>>5/18 Vancomycin 5/10>>5/18 Micafungin 5/17>>>5/18  Cultures: Blood 5/10>>neg Urine 5/10>>neg Lt knee 5/11>>neg Sputum 5/14>>> neg   Lab 12/03/11 0406 12/01/11 0430 11/30/11 0500  WBC 16.0* 17.7* 24.9*   A: Leukocytosis- likely due to high dose steroids.  Trending down but now febrile.   P: -f/u VATS biopsy/cultures -consider abx for ?new HCAP -- 5/22 CXR pending.  -sputum culture, u/a 5/22  Endocrine: A: Steroid induced hyperglycemia P: - monitor CBG/SSI  A: Mild elevation in TSH P: - Will need further assessment as outpt.  BEST PRACTICE / DISPOSITION - Level of Care:  SDU  - Primary Service: PCCM - Consultants: CVTS, ID - Code Status:  Full - Diet:  regular - DVT Px: Lovenox - GI Px:  protonix    WHITEHEART,KATHRYN, NP 12/03/2011  9:41 AM Pager: [phone]  *Care during the described time interval was provided by  me and/or other providers on the critical care team. I have reviewed this patient's available data, including medical history, events of note, physical examination and test results as part of my evaluation.   Attending Addendum:  I have seen the patient, discussed the issues, test results and plans with K. Whiteheart, NP. I agree with the Assessment and Plans as ammended above.   Levy Pupa, MD, PhD 12/03/2011, 11:44 AM [facility] [phone] or if no answer [phone]

## 2011-12-03 NOTE — Progress Notes (Addendum)
5 Days Post-Op Procedure(s) (LRB): VIDEO ASSISTED THORACOSCOPY (Left) LUNG BIOPSY (Left) Subjective:  Robert Mcgee states his breathing continues to improve.  Still complaining of productive cough, denies hemopytsis  Objective: Vital signs in last 24 hours: Temp:  [97.7 F (36.5 C)-101.3 F (38.5 C)] 101.3 F (38.5 C) (05/22 0715) Pulse Rate:  [75-105] 105  (05/22 0715) Cardiac Rhythm:  [-] Normal sinus rhythm (05/22 0715) Resp:  [19-25] 20  (05/22 0715) BP: (135-159)/(67-81) 135/81 mmHg (05/22 0715) SpO2:  [90 %-95 %] 93 % (05/22 0715) FiO2 (%):  [2 %] 2 % (05/21 1227)   Intake/Output from previous day: 05/21 0701 - 05/22 0700 In: 1200 [P.O.:1200] Out: 2200 [Urine:2200] Intake/Output this shift: Total I/O In: 240 [P.O.:240] Out: 325 [Urine:325]  General appearance: alert, cooperative and no distress Heart: regular rate and rhythm Lungs: diminished at bases Abdomen: soft, non-tender; bowel sounds normal; no masses,  no organomegaly Wound: clean and dry  Lab Results:  Basename 12/03/11 0406 12/01/11 0430  WBC 16.0* 17.7*  HGB 11.7* 11.1*  HCT 34.8* 33.9*  PLT 727* 735*   BMET:  Basename 12/03/11 0406 12/01/11 0430  NA 136 137  K 3.6 3.8  CL 101 101  CO2 27 28  GLUCOSE 107* 146*  BUN 19 27*  CREATININE 0.81 0.88  CALCIUM 8.6 8.6    PT/INR: No results found for this basename: LABPROT,INR in the last 72 hours ABG    Component Value Date/Time   PHART 7.420 11/29/2011 0451   HCO3 27.1* 11/29/2011 0451   TCO2 28 11/29/2011 0451   O2SAT 94.0 11/29/2011 0451   CBG (last 3)   Basename 12/03/11 0756 12/02/11 2200 12/02/11 1707  GLUCAP 125* 157* 188*    Assessment/Plan: S/P Procedure(s) (LRB): VIDEO ASSISTED THORACOSCOPY (Left) LUNG BIOPSY (Left)  2. Pulmonary- encouraged aggressive use of IS. Patient remains on oxygen this morning, but has been weaned down to 2L [place] 3. Surgical Pathology- pending 4. CXR- performed, images and results pending, will review once  available 5. Management per Primary service   LOS: 12 days    Lowella Dandy 12/03/2011   patient examined and medical record reviewed,agree with above note. Robert Mcgee,Robert Mcgee 12/03/2011

## 2011-12-04 ENCOUNTER — Inpatient Hospital Stay (HOSPITAL_COMMUNITY): Payer: Medicare Other

## 2011-12-04 ENCOUNTER — Encounter (HOSPITAL_COMMUNITY): Payer: Self-pay

## 2011-12-04 LAB — CBC
HCT: 34.3 % — ABNORMAL LOW (ref 39.0–52.0)
MCV: 92 fL (ref 78.0–100.0)
RBC: 3.73 MIL/uL — ABNORMAL LOW (ref 4.22–5.81)
WBC: 14.6 10*3/uL — ABNORMAL HIGH (ref 4.0–10.5)

## 2011-12-04 LAB — GLUCOSE, CAPILLARY
Glucose-Capillary: 106 mg/dL — ABNORMAL HIGH (ref 70–99)
Glucose-Capillary: 149 mg/dL — ABNORMAL HIGH (ref 70–99)

## 2011-12-04 LAB — BASIC METABOLIC PANEL
CO2: 26 mEq/L (ref 19–32)
Chloride: 103 mEq/L (ref 96–112)
GFR calc Af Amer: >90 mL/min (ref >90)
Potassium: 3.6 mEq/L (ref 3.5–5.1)
Sodium: 139 mEq/L (ref 135–145)

## 2011-12-04 MED ORDER — ENSURE COMPLETE PO LIQD
237.0000 mL | Freq: Three times a day (TID) | ORAL | Status: DC
  Administered 2011-12-04 – 2011-12-05 (×5): 237 mL via ORAL

## 2011-12-04 MED ORDER — SODIUM CHLORIDE 0.9 % IV BOLUS (SEPSIS)
500.0000 mL | Freq: Once | INTRAVENOUS | Status: AC
  Administered 2011-12-04: 500 mL via INTRAVENOUS

## 2011-12-04 MED ORDER — SODIUM CHLORIDE 0.9 % IV SOLN
INTRAVENOUS | Status: DC
  Administered 2011-12-04 – 2011-12-06 (×5): via INTRAVENOUS
  Administered 2011-12-07: 1000 mL via INTRAVENOUS

## 2011-12-04 MED ORDER — ACETAMINOPHEN 500 MG PO TABS
500.0000 mg | ORAL_TABLET | Freq: Four times a day (QID) | ORAL | Status: DC | PRN
  Filled 2011-12-04: qty 1

## 2011-12-04 MED ORDER — CYCLOPHOSPHAMIDE 50 MG PO TABS
150.0000 mg | ORAL_TABLET | Freq: Every day | ORAL | Status: DC
  Administered 2011-12-04 – 2011-12-10 (×7): 150 mg via ORAL
  Filled 2011-12-04 (×9): qty 3

## 2011-12-04 NOTE — Progress Notes (Addendum)
INITIAL ADULT NUTRITION ASSESSMENT Date: 12/04/2011   Time: 11:23 AM  Reason for Assessment: Consult for poor PO intake  ASSESSMENT: Male 71 y.o.  Dx: Metastasis of unknown primary; path pending  Hx:  Past Medical History  Diagnosis Date  . Kidney stone   . Pneumothorax   . Lung nodules    Past Surgical History  Procedure Date  . Varicose vein surgery   . Video assisted thoracoscopy 11/28/2011    Procedure: VIDEO ASSISTED THORACOSCOPY;  Surgeon: Kerin Perna, MD;  Location: [facility];  Service: Thoracic;  Laterality: Left;  Would like to follow first case  . Lung biopsy 11/28/2011    Procedure: LUNG BIOPSY;  Surgeon: Kerin Perna, MD;  Location: [facility] OR;  Service: Thoracic;  Laterality: Left;    Related Meds:  Scheduled Meds:    . cyclophosphamide  150 mg Oral Daily  . enoxaparin (LOVENOX) injection  40 mg Subcutaneous Q24H  . guaiFENesin  600 mg Oral BID  . insulin aspart  0-5 Units Subcutaneous QHS  . insulin aspart  0-9 Units Subcutaneous TID WC  . pantoprazole  40 mg Oral Q1200  . polyethylene glycol  17 g Oral Daily  . predniSONE  60 mg Oral Q breakfast  . senna  1 tablet Oral BID   Continuous Infusions:  PRN Meds:.acetaminophen, albuterol, ipratropium, menthol-cetylpyridinium, morphine injection, ondansetron (ZOFRAN) IV, oxyCODONE-acetaminophen, potassium chloride, sodium chloride, traMADol   Ht: 6\' 2"  (188 cm)  Wt: 208 lb 15.9 oz (94.8 kg)  Ideal Wt: 86.4 kg % Ideal Wt: 110%  Usual Wt: 215 lb 1 week PTA per patient; 215 lb on admission (5/10); 212 lb 1 week ago % Usual Wt: 98%  (2% weight loss in one week)  Body mass index is 26.83 kg/(m^2).  Food/Nutrition Related Hx: good appetite and good PO intake PTA; Poor appetite and poor PO intake since admission  Labs:  CMP     Component Value Date/Time   NA 139 12/04/2011 0440   K 3.6 12/04/2011 0440   CL 103 12/04/2011 0440   CO2 26 12/04/2011 0440   GLUCOSE 102* 12/04/2011 0440   BUN 22 12/04/2011 0440   CREATININE 0.86 12/04/2011 0440   CALCIUM 8.5 12/04/2011 0440   PROT 5.8* 11/30/2011 0500   ALBUMIN 1.8* 11/30/2011 0500   AST 15 11/30/2011 0500   ALT 24 11/30/2011 0500   ALKPHOS 132* 11/30/2011 0500   BILITOT 0.4 11/30/2011 0500   GFRNONAA 85* 12/04/2011 0440   GFRAA >90 12/04/2011 0440    CBG (last 3)   Basename 12/04/11 0802 12/03/11 2138 12/03/11 1737  GLUCAP 106* 144* 174*      Intake/Output Summary (Last 24 hours) at 12/04/11 1134 Last data filed at 12/04/11 0744  Gross per 24 hour  Intake    360 ml  Output    701 ml  Net   -341 ml    Diet Order: CHO-modified medium   Supplements/Tube Feeding: None  Estimated Nutritional Needs:   Kcal: 2300-2500 Protein: 120-140 grams Fluid: 2.3-2.5 liters  Patient with non-severe (moderate) malnutrition in the context of acute illness given 2% weight loss in the past week, mild muscle loss (mild temporal wasting), and intake <75% of estimated energy needs for >7 days.  Patient has been consuming <50% of meals since admission.  Per RN, plans are to begin chemotherapy today.  Patient complains of a sore throat and poor appetite causing intake to be poor.  Does well with liquids.  Patient willing to  try vanilla and strawberry Ensure Complete supplement to help increase PO intake.  Elevated CBG's likely related to Prednisone.  CBG's have been controlled fairly well with sliding scale insulin.  NUTRITION DIAGNOSIS: -Inadequate oral intake (NI-2.1).  Status: Ongoing  RELATED TO: poor appetite  AS EVIDENCED BY: <50% meal completion since admission  MONITORING/EVALUATION(Goals):  Goal:  Intake to meet 90-100% of estimated nutrition needs to prevent further weight loss while hospitalized and during chemo treatment.  Monitor:  Labs, weight trend, PO intake.  EDUCATION NEEDS: -Education needs addressed, discussed ways to increase intake of protein and calories.  INTERVENTION:  Ensure Complete TID between meals to maximize oral  intake.  RD provided menu and reviewed meal options with patient and his family.  Consider liberalizing diet to regular to increase meal options.  Dietitian #:  [phone]  DOCUMENTATION CODES Per approved criteria  -Non-severe (moderate) malnutrition in the context of acute illness or injury    Hettie Holstein 12/04/2011, 11:23 AM

## 2011-12-04 NOTE — Progress Notes (Signed)
                    [address]            [place] [address]          [phone]     6 Days Post-Op Procedure(s) (LRB): VIDEO ASSISTED THORACOSCOPY (Left) LUNG BIOPSY (Left)  Subjective: Down to 3L O2. Still with cough productive of bloody sputum.  Objective: Vital signs in last 24 hours: Patient Vitals for the past 24 hrs:  BP Temp Temp src Pulse Resp SpO2  12/04/11 0700 130/61 mmHg 100.7 F (38.2 C) Oral - - -  12/04/11 0400 130/56 mmHg - - 82  25  92 %  12/04/11 0357 - 99.7 F (37.6 C) Oral - - -  12/04/11 0000 115/48 mmHg - - 69  21  94 %  12/03/11 2337 - 97.9 F (36.6 C) Oral - - -  12/03/11 2011 - 97.7 F (36.5 C) Oral - - -  12/03/11 2000 117/58 mmHg - - 73  17  91 %  12/03/11 1500 - - Oral - - -  12/03/11 1250 - 98.5 F (36.9 C) Oral 89  25  90 %  12/03/11 1208 113/64 mmHg 98.5 F (36.9 C) Oral 88  26  93 %   Current Weight  11/30/11 208 lb 15.9 oz (94.8 kg)     Intake/Output from previous day: 05/22 0701 - 05/23 0700 In: 600 [P.O.:600] Out: 901 [Urine:900; Stool:1]    PHYSICAL EXAM:  Heart: RRR Lungs: slightly decreased in L base, no wheezes or rhonchi Wound: clean and dry   Lab Results: CBC: Basename 12/04/11 0440 12/03/11 0406  WBC 14.6* 16.0*  HGB 11.4* 11.7*  HCT 34.3* 34.8*  PLT 615* 727*   BMET:  Basename 12/04/11 0440 12/03/11 0406  NA 139 136  K 3.6 3.6  CL 103 101  CO2 26 27  GLUCOSE 102* 107*  BUN 22 19  CREATININE 0.86 0.81  CALCIUM 8.5 8.6    CXR: no obvious ptx, otherwise stable  Assessment/Plan: S/P Procedure(s) (LRB): VIDEO ASSISTED THORACOSCOPY (Left) LUNG BIOPSY (Left) Stable from surgery standpoint. Continue care per pulm/CCM. Path pending.   LOS: 13 days    Cheney Gosch H 12/04/2011

## 2011-12-04 NOTE — Progress Notes (Signed)
PCCM Progress Note  HISTORY of PRESENT ILLNESS:  Robert Mcgee is a 71 y.o. male admitted on 11/21/2011 with fever, polyarthralgia, and found to have multiple pulmonary nodules.  PCCM consulted 5/13 to assess progressive b/l pulmonary infiltrates, dyspnea, and hypoxia.  PET/CT with diffuse uptake.  Started on high dose steroids and abx upon admission.   Tests/events 03/13/11 CT abd/pelvis>>8 mm RML nodule 11/17/11 CT chest>>borderline hilar/mediastinal adenopathy, b/l rounded nodules of varying size up to 2.6 cm 11/17/11 CT abd/pelvis>>abnormal soft tissue surrounding bifurcation of abdominal aorta 11/21/11 Scrotal u/s>>no evidence of testicular masses 11/24/11 PET scan>>numerous b/l pulmonary nodules with SUV up to 12.1, SUV 6.2 in abnormal soft tissue around abdominal aorta, SUV 6.1 Lt prostate   11/22/11 Echo>>mild LVH, EF 65 to 70%, grade 1 diastolic dysfx, mild AS, mild LA/RA dilation, PAS 42 mmHg 5/16: Tx to [facility] for VATS 5/17: VATS 5/18: Extubated, C- ANCA 1:640  Subjective/ Overnight:  Feeling a bit better.  Still has some prod cough but denies hemoptysis.  Febrile overnight.   Temp:  [97.7 F (36.5 C)-100.7 F (38.2 C)] 100.7 F (38.2 C) (05/23 0700) Pulse Rate:  [69-89] 82  (05/23 0400) Cardiac Rhythm:  [-] Normal sinus rhythm (05/23 0730) Resp:  [17-26] 25  (05/23 0400) BP: (113-130)/(48-64) 130/61 mmHg (05/23 0700) SpO2:  [90 %-94 %] 92 % (05/23 0400) 3L [place]   I/O last 3 completed shifts: In: 840 [P.O.:840] Out: 2051 [Urine:2050; Stool:1]  Intake/Output Summary (Last 24 hours) at 12/04/11 1027 Last data filed at 12/04/11 0744  Gross per 24 hour  Intake    360 ml  Output    701 ml  Net   -341 ml     Physical exam: General - elderly male in no acute distress HEENT - PERRLA Chest - resps even non labored on 3L [place], diminished bases, few scattered crackles  Cardiac - s1s2 regular, no murmur Abd - soft, nontender +bowel sounds Ext - scant BLE edema   Neuro  - GCS 15, no focal deficits Psych - normal mood, behavior Skin - no rashes  Dg Chest 2 View  12/04/2011  *RADIOLOGY REPORT*  Clinical Data: Short of breath  CHEST - 2 VIEW  Comparison: 12/02/2011  Findings: Dense bilateral airspace disease with a patchy perihilar pattern has slightly improved.  Right internal jugular center venous catheter stable.  Normal heart size.  No pneumothorax.  IMPRESSION: Improved bilateral airspace disease.  Original Report Authenticated By: Donavan Burnet, M.D.   Dg Chest Port 1 View  12/04/2011  *RADIOLOGY REPORT*  Clinical Data: Infiltrates.  PORTABLE CHEST - 1 VIEW  Comparison: 12/03/2011.  Findings: Trachea is midline.  Heart size stable.  Right IJ central line tip projects over the SVC.  Lungs are low in volume with patchy bilateral air space disease, as before.  No definite pleural fluid.  IMPRESSION: Persistent patchy bilateral air space disease.  Original Report Authenticated By: Reyes Ivan, M.D.    CBC    Component Value Date/Time   WBC 14.6* 12/04/2011 0440   RBC 3.73* 12/04/2011 0440   HGB 11.4* 12/04/2011 0440   HCT 34.3* 12/04/2011 0440   PLT 615* 12/04/2011 0440   MCV 92.0 12/04/2011 0440   MCH 30.6 12/04/2011 0440   MCHC 33.2 12/04/2011 0440   RDW 16.8* 12/04/2011 0440   LYMPHSABS 1.3 11/30/2011 0500   MONOABS 1.3* 11/30/2011 0500   EOSABS 0.0 11/30/2011 0500   BASOSABS 0.0 11/30/2011 0500  BMET    Component Value Date/Time   NA 139 12/04/2011 0440   K 3.6 12/04/2011 0440   CL 103 12/04/2011 0440   CO2 26 12/04/2011 0440   GLUCOSE 102* 12/04/2011 0440   BUN 22 12/04/2011 0440   CREATININE 0.86 12/04/2011 0440   CALCIUM 8.5 12/04/2011 0440   GFRNONAA 85* 12/04/2011 0440   GFRAA >90 12/04/2011 0440    ASSESSMENT/PLAN:   Pulmonary: ANCA>>>POS  1: 640   ANA>>>neg CEA>>>neg RF>>>18 RMSF>>>neg  A: Acute hypoxic respiratory failure with progressive b/l pulmonary infiltrates with more peripheral distribution.  S/p VATS 5/17.  This has been  associated with symmetric b/l lower extremity polyarthralgia.  He required prednisone therapy to treat sinus infection in Fall 2012. 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  - F/u CXR 5/24 -continue supplemental oxygen to keep sats >92%  -Pulmonary hygiene  -follow final path results -cont BD -aggressive pulm hygiene  -PT/OT  Cardiac: ECHO: EF 65%, grade I diastolic dysfunction and PAP of 42.  A: Hypertension-resolved P: - Monitor   Renal:  5/16 UA: Hyaline casts 5/20 UA: Moderate leukocytes, large Hb  A: Ureteral stone  - Seen by urology. - F/u with urology as outpt.  Musculoskeletal: Lt knee aspiration 5/11>>neg  A: Joint pain, getting better  P: - Continue pain meds -pt/ot  Gastro-intestinal:  A: Constipation, Mouth ulcers, abnormal soft tissue swelling around abdominal aorta on CT and ?infiltrative process throughout spleen.  ?lymphoma.  -Continue bowel regimen -Monitor oral exam -?if he needs bx of abdominal mass > appears to be smaller on f/u CT scan after pred, can likely defer for now but he will need f/u imaging in a few months  A: protein calorie malnutrition - nutrition consult for suppliments  ID:  Antibiotics: Doxycycline 5/10>>5/11 Zosyn 5/10>>5/18 Vancomycin 5/10>>5/18 Micafungin 5/17>>>5/18  Cultures: Blood 5/10>>neg Urine 5/10>>neg Lt knee 5/11>>neg Sputum 5/14>>> neg   Lab 12/04/11 0440 12/03/11 0406 12/01/11 0430  WBC 14.6* 16.0* 17.7*   A: Leukocytosis- likely due to high dose steroids.  Trending down, has had low grade fevers. UA 5/22 small LE, nit negative P: -f/u VATS biopsy/cultures -follow CXR -sputum culture  Endocrine: A: Steroid induced hyperglycemia P: - monitor CBG/SSI  A: Mild elevation in  TSH P: - Will need further assessment as outpt.  BEST PRACTICE / DISPOSITION - Level of Care:  SDU  - Primary Service: PCCM - Consultants: CVTS, ID - Code Status:  Full - Diet:  regular - DVT Px: Lovenox - GI Px:  protonix      Levy Pupa, MD, PhD 12/04/2011, 10:27 AM  Pulmonary and Critical Care [phone] or if no answer [phone]

## 2011-12-04 NOTE — Evaluation (Signed)
Occupational Therapy Evaluation Patient Details Name: Robert Mcgee MRN: [id number] DOB: 1940/12/04 Today's Date: 12/04/2011 Time: 4098-1191 OT Time Calculation (min): 19 min  OT Assessment / Plan / Recommendation Clinical Impression  Pt s/p VAT and lung biopsy. Pt presents with generalized weakness, decreased pulomnary status, pain and overall decreased independence with ADLs. Will benefit from skilled OT in the acute setting to maximize I with ADL and ADL mobility prior to d/c home    OT Assessment  Patient needs continued OT Services    Follow Up Recommendations  Home health OT;Supervision/Assistance - 24 hour    Barriers to Discharge      Equipment Recommendations  None recommended by PT (TBD but do not anticipate any needs)    Recommendations for Other Services    Frequency  Min 2X/week    Precautions / Restrictions Precautions Precautions: Fall Restrictions Weight Bearing Restrictions: No   Pertinent Vitals/Pain Denies pain    ADL  Grooming: Performed;Wash/dry hands;Min guard Where Assessed - Grooming: Unsupported standing Lower Body Dressing: Simulated;Minimal assistance Where Assessed - Lower Body Dressing: Unsupported sit to stand Toilet Transfer: Simulated;Min guard Toilet Transfer Method: Sit to stand Toileting - Architect and Hygiene: Simulated;Min guard Where Assessed - Engineer, mining and Hygiene: Standing Equipment Used: Rolling walker Transfers/Ambulation Related to ADLs: Min guard a with RW ambulation >182ft ADL Comments: Pt easily "winded" with 02 dropping to 83-84% on 4L 02. Would return to 90-94% with 30sec rest breaks.     OT Diagnosis: Generalized weakness;Acute pain  OT Problem List: Decreased strength;Decreased activity tolerance;Decreased knowledge of use of DME or AE;Decreased knowledge of precautions;Pain;Cardiopulmonary status limiting activity OT Treatment Interventions: Self-care/ADL training;Therapeutic  activities;Patient/family education;DME and/or AE instruction   OT Goals Acute Rehab OT Goals OT Goal Formulation: With patient Time For Goal Achievement: 12/11/11 Potential to Achieve Goals: Good ADL Goals Pt Will Perform Grooming: with modified independence;Standing at sink ADL Goal: Grooming - Progress: Goal set today Pt Will Perform Upper Body Bathing: with set-up;Sitting at sink;Sitting, chair;Sitting, edge of bed ADL Goal: Upper Body Bathing - Progress: Goal set today Pt Will Perform Lower Body Bathing: with set-up;with supervision;Sit to stand from chair;Sit to stand in shower;Sit to stand from bed ADL Goal: Lower Body Bathing - Progress: Goal set today Pt Will Perform Lower Body Dressing: with supervision;with set-up;Sit to stand from chair;Sit to stand from bed ADL Goal: Lower Body Dressing - Progress: Goal set today Pt Will Transfer to Toilet: with modified independence;Ambulation;with DME;3-in-1 ADL Goal: Toilet Transfer - Progress: Goal set today Pt Will Perform Toileting - Clothing Manipulation: with modified independence;Standing ADL Goal: Toileting - Clothing Manipulation - Progress: Goal set today Pt Will Perform Tub/Shower Transfer: Shower transfer;Ambulation;with DME;Shower seat without back ADL Goal: Web designer - Progress: Goal set today Additional ADL Goal #1: Pt will verbalize/demonstrate 3/3 energy conservation techniques during all ADL tasks. ADL Goal: Additional Goal #1 - Progress: Goal set today  Visit Information  Last OT Received On: 12/04/11 Assistance Needed: +1 PT/OT Co-Evaluation/Treatment: Yes    Subjective Data  Subjective: I'm not quite feeling back to my normal self Patient Stated Goal: Return home; return to doing physical labor around the house   Prior Functioning  Home Living Lives With: Spouse Available Help at Discharge: Family;Available PRN/intermittently (Wife works 4 10hr days, Son can stay during day.  ) Type of Home:  House Home Access: Stairs to enter [employer] of Steps: 2 Entrance Stairs-Rails: Right;Left;Can reach both Home Layout: One level Bathroom Shower/Tub: Tub/shower unit;Curtain  Bathroom Toilet: Pharmacist, community: Yes How Accessible: Accessible via walker Home Adaptive Equipment: Straight cane;Walker - rolling Prior Function Level of Independence: Independent Able to Take Stairs?: Yes Driving: Yes Vocation: Retired Musician: No difficulties Dominant Hand: Right    Cognition  Overall Cognitive Status: Appears within functional limits for tasks assessed/performed Arousal/Alertness: Awake/alert Orientation Level: Oriented X4 / Intact Behavior During Session: WFL for tasks performed    Extremity/Trunk Assessment Right Upper Extremity Assessment RUE ROM/Strength/Tone: Within functional levels RUE Sensation: WFL - Light Touch RUE Coordination: WFL - gross/fine motor Left Upper Extremity Assessment LUE ROM/Strength/Tone: Within functional levels LUE Sensation: WFL - Light Touch LUE Coordination: WFL - gross/fine motor Right Lower Extremity Assessment RLE ROM/Strength/Tone: WFL for tasks assessed RLE Sensation: WFL - Light Touch Left Lower Extremity Assessment LLE ROM/Strength/Tone: WFL for tasks assessed LLE Sensation: WFL - Light Touch   Mobility Bed Mobility Details for Bed Mobility Assistance: pt observed through window Independently sitting up to EOB just prior to PT/OT arriving.   Transfers Sit to Stand: 4: Min guard;With upper extremity assist;With armrests;From chair/3-in-1 Stand to Sit: 4: Min guard;With upper extremity assist;With armrests;To chair/3-in-1 Details for Transfer Assistance: cues for use of UEs and to control descent   Exercise    Balance Balance Balance Assessed: No  End of Session OT - End of Session Equipment Utilized During Treatment: Gait belt Activity Tolerance:  (limited by pulmonary status) Patient  left: in chair;with call bell/phone within reach;with family/visitor present   Zebedee Segundo 12/04/2011, 4:11 PM

## 2011-12-04 NOTE — Evaluation (Signed)
Physical Therapy Evaluation Patient Details Name: Robert Mcgee MRN: [id number] DOB: [birth year] Today's Date: 12/04/2011 Time: 1420-1440 PT Time Calculation (min): 20 min  PT Assessment / Plan / Recommendation Clinical Impression  pt presents with hypoxia.  pt very motivated to return to [facility] and anticipate will make great progress.  pt limited by SOB and O2 sats.      PT Assessment  Patient needs continued PT services    Follow Up Recommendations  Home health PT;Supervision - Intermittent    Barriers to Discharge None      lEquipment Recommendations  None recommended by PT (TBD but do not anticipate any needs)    Recommendations for Other Services     Frequency Min 3X/week    Precautions / Restrictions Precautions Precautions: Fall Restrictions Weight Bearing Restrictions: No   Pertinent Vitals/Pain No c/o      Mobility  Bed Mobility Details for Bed Mobility Assistance: pt observed through window Independently sitting up to EOB just prior to PT arriving.   Transfers Transfers: Sit to Stand;Stand to Sit Sit to Stand: 4: Min guard;With upper extremity assist;With armrests;From chair/3-in-1 Stand to Sit: 4: Min guard;With upper extremity assist;With armrests;To chair/3-in-1 Details for Transfer Assistance: cues for use of UEs and to control descent Ambulation/Gait Ambulation/Gait Assistance: 4: Min guard Ambulation Distance (Feet): 250 Feet Assistive device: Rolling walker Ambulation/Gait Assistance Details: cues for deep breathing, upright posture.  pt's O2 sats decreased to 83% on 2L O2 and required 4L O2 to maintain sats in 90's.   Gait Pattern: Step-through pattern;Decreased stride length Stairs: No Wheelchair Mobility Wheelchair Mobility: No    Exercises     PT Diagnosis: Difficulty walking  PT Problem List: Decreased activity tolerance;Decreased balance;Decreased mobility;Decreased knowledge of use of DME;Cardiopulmonary status limiting activity PT Treatment  Interventions: DME instruction;Gait training;Stair training;Functional mobility training;Therapeutic activities;Therapeutic exercise;Balance training;Neuromuscular re-education;Patient/family education   PT Goals Acute Rehab PT Goals PT Goal Formulation: With patient Time For Goal Achievement: 12/18/11 Potential to Achieve Goals: Good Pt will go Sit to Supine/Side: Independently PT Goal: Sit to Supine/Side - Progress: Goal set today Pt will go Sit to Stand: Independently PT Goal: Sit to Stand - Progress: Goal set today Pt will go Stand to Sit: Independently PT Goal: Stand to Sit - Progress: Goal set today Pt will Ambulate: >150 feet;with modified independence;with least restrictive assistive device PT Goal: Ambulate - Progress: Goal set today Pt will Go Up / Down Stairs: 3-5 stairs;with supervision;with rail(s) PT Goal: Up/Down Stairs - Progress: Goal set today  Visit Information  Last PT Received On: 12/04/11 Assistance Needed: +1 PT/OT Co-Evaluation/Treatment: Yes    Subjective Data  Subjective: [name] works at a divorce lawyers office Patient Stated Goal: Be independent again   Prior Functioning  Home Living Lives With: Spouse Available Help at Discharge: Family;Available PRN/intermittently (Wife works 4 10hr days, Son can stay during day.  ) Type of Home: House Home Access: Stairs to enter [employer] of Steps: 2 Entrance Stairs-Rails: Right;Left;Can reach both Home Layout: One level Bathroom Shower/Tub: Forensic scientist: Standard Bathroom Accessibility: Yes How Accessible: Accessible via walker Home Adaptive Equipment: Straight cane;Walker - rolling Prior Function Level of Independence: Independent Able to Take Stairs?: Yes Driving: Yes Vocation: Retired Musician: No difficulties Dominant Hand: Right    Cognition  Overall Cognitive Status: Appears within functional limits for tasks  assessed/performed Arousal/Alertness: Awake/alert Orientation Level: Oriented X4 / Intact Behavior During Session: [facility] for tasks performed    Extremity/Trunk Assessment Right Upper  Extremity Assessment RUE ROM/Strength/Tone: Within functional levels RUE Sensation: WFL - Light Touch RUE Coordination: WFL - gross/fine motor Left Upper Extremity Assessment LUE ROM/Strength/Tone: Within functional levels LUE Sensation: WFL - Light Touch LUE Coordination: WFL - gross/fine motor Right Lower Extremity Assessment RLE ROM/Strength/Tone: WFL for tasks assessed RLE Sensation: WFL - Light Touch Left Lower Extremity Assessment LLE ROM/Strength/Tone: WFL for tasks assessed LLE Sensation: WFL - Light Touch   Balance Balance Balance Assessed: No  End of Session PT - End of Session Equipment Utilized During Treatment: Gait belt;Oxygen Activity Tolerance: Patient tolerated treatment well Patient left: in chair;with call bell/phone within reach;with family/visitor present Nurse Communication: Mobility status   Robert Mcgee, [place] [phone] 12/04/2011, 3:33 PM

## 2011-12-05 LAB — CBC
HCT: 31.1 % — ABNORMAL LOW (ref 39.0–52.0)
Hemoglobin: 10.2 g/dL — ABNORMAL LOW (ref 13.0–17.0)
MCV: 90.9 fL (ref 78.0–100.0)
RBC: 3.42 MIL/uL — ABNORMAL LOW (ref 4.22–5.81)
WBC: 14.7 10*3/uL — ABNORMAL HIGH (ref 4.0–10.5)

## 2011-12-05 LAB — DIFFERENTIAL
Eosinophils Relative: 0 % (ref 0–5)
Lymphocytes Relative: 11 % — ABNORMAL LOW (ref 12–46)
Lymphs Abs: 1.6 10*3/uL (ref 0.7–4.0)
Monocytes Absolute: 0.7 10*3/uL (ref 0.1–1.0)
Monocytes Relative: 5 % (ref 3–12)

## 2011-12-05 LAB — GLUCOSE, CAPILLARY
Glucose-Capillary: 121 mg/dL — ABNORMAL HIGH (ref 70–99)
Glucose-Capillary: 169 mg/dL — ABNORMAL HIGH (ref 70–99)

## 2011-12-05 LAB — BASIC METABOLIC PANEL
CO2: 25 mEq/L (ref 19–32)
Calcium: 8.1 mg/dL — ABNORMAL LOW (ref 8.4–10.5)
Creatinine, Ser: 0.83 mg/dL (ref 0.50–1.35)
Glucose, Bld: 110 mg/dL — ABNORMAL HIGH (ref 70–99)

## 2011-12-05 LAB — PHOSPHORUS: Phosphorus: 1.9 mg/dL — ABNORMAL LOW (ref 2.3–4.6)

## 2011-12-05 MED ORDER — SODIUM PHOSPHATE 3 MMOLE/ML IV SOLN
30.0000 mmol | Freq: Once | INTRAVENOUS | Status: AC
  Administered 2011-12-05: 30 mmol via INTRAVENOUS
  Filled 2011-12-05: qty 10

## 2011-12-05 MED ORDER — SODIUM CHLORIDE 0.9 % IJ SOLN
INTRAMUSCULAR | Status: AC
  Administered 2011-12-05: 10 mL via INTRAVENOUS
  Filled 2011-12-05: qty 10

## 2011-12-05 MED ORDER — SODIUM CHLORIDE 0.9 % IJ SOLN
INTRAMUSCULAR | Status: AC
  Administered 2011-12-05: 10 mL
  Filled 2011-12-05: qty 20

## 2011-12-05 MED ORDER — WHITE PETROLATUM GEL
Status: AC
  Filled 2011-12-05: qty 5

## 2011-12-05 MED ORDER — ONDANSETRON HCL 4 MG PO TABS
4.0000 mg | ORAL_TABLET | Freq: Three times a day (TID) | ORAL | Status: DC | PRN
  Administered 2011-12-06 – 2011-12-08 (×3): 4 mg via ORAL
  Filled 2011-12-05 (×3): qty 1

## 2011-12-05 NOTE — Progress Notes (Signed)
  Harrel Carina [place]   OTR/L Pager: [phone] Office: [phone] . Agree with student note

## 2011-12-05 NOTE — Progress Notes (Signed)
eLink Physician-Brief Progress Note Patient Name: Chet Greenley DOB: April 09, 1941 MRN: [id number]  Date of Service  12/05/2011   HPI/Events of Note  Hypophosphatemia and hypokalemia   eICU Interventions  Phos and potassium replaced   Intervention Category Intermediate Interventions: Electrolyte abnormality - evaluation and management  Keylen Eckenrode 12/05/2011, 5:10 AM

## 2011-12-05 NOTE — Progress Notes (Signed)
Occupational Therapy Treatment Patient Details Name: Robert Mcgee MRN: [id number] DOB: 04-10-1941 Today's Date: 12/05/2011 Time: 9562-1308 OT Time Calculation (min): 28 min  OT Assessment / Plan / Recommendation Comments on Treatment Session Pt. walked with rolling walker down the hall ~100 and was educated on energy conservation techniques to address his SOB and decreased O2 levels.    Follow Up Recommendations       Barriers to Discharge       Equipment Recommendations  Rolling walker with 5" wheels;3 in 1 bedside comode    Recommendations for Other Services    Frequency     Plan Discharge plan remains appropriate    Precautions / Restrictions Precautions Precautions: Fall Restrictions Weight Bearing Restrictions: No   Pertinent Vitals/Pain Monitored and stable    ADL  Equipment Used: Gait belt;Rolling walker;Other (comment) (o2 tank) ADL Comments: Pt taughter with teach back method and able to verbalize 2 EC techniques for d/c planning. Wife present for all education.    OT Diagnosis:    OT Problem List:   OT Treatment Interventions:     OT Goals ADL Goals Pt Will Transfer to Toilet: with modified independence;Ambulation;with DME;3-in-1 ADL Goal: Toilet Transfer - Progress: Progressing toward goals Pt Will Perform Toileting - Clothing Manipulation: with modified independence;Standing ADL Goal: Toileting - Clothing Manipulation - Progress: Progressing toward goals Additional ADL Goal #1: Pt will verbalize/demonstrate 3/3 energy conservation techniques during all ADL tasks. ADL Goal: Additional Goal #1 - Progress: Progressing toward goals  Visit Information  Last OT Received On: 12/04/11 Assistance Needed: +1    Subjective Data      Prior Functioning       Cognition  Overall Cognitive Status: Appears within functional limits for tasks assessed/performed Arousal/Alertness: Awake/alert Orientation Level: Oriented X4 / Intact Behavior During Session: [facility] for  tasks performed    Mobility Transfers Sit to Stand: 6: Modified independent (Device/Increase time);With upper extremity assist;With armrests;From chair/3-in-1 Stand to Sit: 6: Modified independent (Device/Increase time);With upper extremity assist;To chair/3-in-1   Exercises    Balance    End of Session OT - End of Session Equipment Utilized During Treatment:  (walker) Patient left: in chair;with call bell/phone within reach;with family/visitor present   Jenell Milliner 12/05/2011, 9:49 AM

## 2011-12-05 NOTE — Progress Notes (Signed)
PT Cancellation Note  Treatment cancelled today due to medical issues with patient which prohibited therapy. Patient with increased nausea this afternoon. Will check back on patient Monday.   Thanks 12/05/2011 Fredrich Birks PTA [phone] pager [phone] office     Fredrich Birks 12/05/2011, 1:56 PM

## 2011-12-05 NOTE — Progress Notes (Signed)
                    [address]            [place] [address]          [phone]     7 Days Post-Op Procedure(s) (LRB): VIDEO ASSISTED THORACOSCOPY (Left) LUNG BIOPSY (Left)  Subjective: Stable, no new issues.  Objective: Vital signs in last 24 hours: Patient Vitals for the past 24 hrs:  BP Temp Temp src Pulse Resp SpO2  12/05/11 0809 - 98.4 F (36.9 C) Oral - - -  12/05/11 0356 134/63 mmHg 98.3 F (36.8 C) Oral 76  21  92 %  12/05/11 0000 - - - 70  22  94 %  12/04/11 2347 127/56 mmHg 98.4 F (36.9 C) Oral 75  19  94 %  12/04/11 2000 - - - 66  22  93 %  12/04/11 1923 109/61 mmHg 98.1 F (36.7 C) Oral 64  19  91 %  12/04/11 1700 104/58 mmHg - - - - -  12/04/11 1542 82/44 mmHg 97.9 F (36.6 C) Oral - - 93 %  12/04/11 1200 110/65 mmHg 98.1 F (36.7 C) Oral - - -   Current Weight  11/30/11 208 lb 15.9 oz (94.8 kg)     Intake/Output from previous day: 05/23 0701 - 05/24 0700 In: 1140 [P.O.:240; I.V.:900] Out: 375 [Urine:375]    PHYSICAL EXAM:  Heart: RRR Lungs: decreased BS in bases Wound: stable   Lab Results: CBC: Basename 12/05/11 0400 12/04/11 0440  WBC 14.7* 14.6*  HGB 10.2* 11.4*  HCT 31.1* 34.3*  PLT 506* 615*   BMET:  Basename 12/05/11 0400 12/04/11 0440  NA 139 139  K 3.4* 3.6  CL 105 103  CO2 25 26  GLUCOSE 110* 102*  BUN 22 22  CREATININE 0.83 0.86  CALCIUM 8.1* 8.5    PT/INR: No results found for this basename: LABPROT,INR in the last 72 hours   Assessment/Plan: S/P Procedure(s) (LRB): VIDEO ASSISTED THORACOSCOPY (Left) LUNG BIOPSY (Left) Stable from surgery. Hypokalemia- replace K+. Continue current care per CCM.   LOS: 14 days    Robert Mcgee H 12/05/2011

## 2011-12-05 NOTE — Progress Notes (Signed)
PCCM Progress Note  HISTORY of PRESENT ILLNESS:  Robert Mcgee is a 71 y.o. male admitted on 11/21/2011 with fever, polyarthralgia, and found to have multiple pulmonary nodules.  PCCM consulted 5/13 to assess progressive b/l pulmonary infiltrates, dyspnea, and hypoxia.  PET/CT with diffuse uptake.  Started on high dose steroids and abx upon admission.   Tests/events 03/13/11 CT abd/pelvis>>8 mm RML nodule 11/17/11 CT chest>>borderline hilar/mediastinal adenopathy, b/l rounded nodules of varying size up to 2.6 cm 11/17/11 CT abd/pelvis>>abnormal soft tissue surrounding bifurcation of abdominal aorta 11/21/11 Scrotal u/s>>no evidence of testicular masses 11/24/11 PET scan>>numerous b/l pulmonary nodules with SUV up to 12.1, SUV 6.2 in abnormal soft tissue around abdominal aorta, SUV 6.1 Lt prostate   11/22/11 Echo>>mild LVH, EF 65 to 70%, grade 1 diastolic dysfx, mild AS, mild LA/RA dilation, PAS 42 mmHg 5/16: Tx to [facility] for VATS 5/17: VATS 5/18: Extubated, C- ANCA 1:640 5/23 cytoxan started  Subjective/ Overnight:  Started cytoxan 5/23 Period of relative hypotension 5/23 afternoon, responded to IVF bolus Note PT, OT, nutrition recs  Temp:  [97.9 F (36.6 C)-98.4 F (36.9 C)] 98.4 F (36.9 C) (05/24 0809) Pulse Rate:  [64-76] 76  (05/24 0356) Cardiac Rhythm:  [-] Normal sinus rhythm (05/23 2347) Resp:  [19-22] 21  (05/24 0356) BP: (82-134)/(44-65) 134/63 mmHg (05/24 0356) SpO2:  [91 %-94 %] 92 % (05/24 0356)   I/O last 3 completed shifts: In: 1260 [P.O.:360; I.V.:900] Out: 950 [Urine:950]  Intake/Output Summary (Last 24 hours) at 12/05/11 1610 Last data filed at 12/05/11 9604  Gross per 24 hour  Intake   1160 ml  Output    600 ml  Net    560 ml     Physical exam: General - elderly male in no acute distress HEENT - PERRLA Chest - resps even non labored, diminished bases, few scattered crackles  Cardiac - s1s2 regular, no murmur Abd - soft, nontender +bowel  sounds Ext - scant BLE edema   Neuro - GCS 15, no focal deficits Psych - normal mood, behavior Skin - no rashes  Dg Chest Port 1 View  12/04/2011  *RADIOLOGY REPORT*  Clinical Data: Infiltrates.  PORTABLE CHEST - 1 VIEW  Comparison: 12/03/2011.  Findings: Trachea is midline.  Heart size stable.  Right IJ central line tip projects over the SVC.  Lungs are low in volume with patchy bilateral air space disease, as before.  No definite pleural fluid.  IMPRESSION: Persistent patchy bilateral air space disease.  Original Report Authenticated By: Reyes Ivan, M.D.    CBC    Component Value Date/Time   WBC 14.7* 12/05/2011 0400   RBC 3.42* 12/05/2011 0400   HGB 10.2* 12/05/2011 0400   HCT 31.1* 12/05/2011 0400   PLT 506* 12/05/2011 0400   MCV 90.9 12/05/2011 0400   MCH 29.8 12/05/2011 0400   MCHC 32.8 12/05/2011 0400   RDW 16.7* 12/05/2011 0400   LYMPHSABS 1.6 12/05/2011 0400   MONOABS 0.7 12/05/2011 0400   EOSABS 0.1 12/05/2011 0400   BASOSABS 0.0 12/05/2011 0400    BMET    Component Value Date/Time   NA 139 12/05/2011 0400   K 3.4* 12/05/2011 0400   CL 105 12/05/2011 0400   CO2 25 12/05/2011 0400   GLUCOSE 110* 12/05/2011 0400   BUN 22 12/05/2011 0400   CREATININE 0.83 12/05/2011 0400   CALCIUM 8.1* 12/05/2011 0400   GFRNONAA 86* 12/05/2011 0400   GFRAA >90 12/05/2011 0400  ASSESSMENT/PLAN:   Pulmonary: ANCA>>>POS  1: 640   ANA>>>neg CEA>>>neg RF>>>18 RMSF>>>neg  A: Acute hypoxic respiratory failure with progressive b/l pulmonary infiltrates with more peripheral distribution.  S/p VATS 5/17.  This has been associated with symmetric b/l lower extremity polyarthralgia.  He required prednisone therapy to treat sinus infection in Fall 2012. 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  - F/u CXR 5/25 -continue supplemental oxygen to keep sats >92%  -Pulmonary hygiene  -follow final path results -cont BD -aggressive pulm hygiene  -PT/OT  Cardiac: ECHO: EF 65%, grade I diastolic dysfunction and PAP of 42.  A: Hypertension-resolved P: - Monitor   Renal:  5/16 UA: Hyaline casts 5/20 UA: Moderate leukocytes, large Hb  A: Ureteral stone  - Seen by urology. - F/u with urology as outpt.  Musculoskeletal: Lt knee aspiration 5/11>>neg  A: Joint pain, getting better  P: - Continue pain meds -pt/ot  Gastro-intestinal:  A: Constipation, Mouth ulcers, abnormal soft tissue swelling around abdominal aorta on CT and ?infiltrative process throughout spleen.  ?lymphoma.  -Continue bowel regimen -Monitor oral exam -?if he needs bx of abdominal mass > appears to be smaller on f/u CT scan after pred, can likely defer for now but he will need f/u imaging in a few months  A: protein calorie malnutrition - nutrition consult for supplements >> Ensure Complete ordered TID  ID:  Antibiotics: Doxycycline 5/10>>5/11 Zosyn 5/10>>5/18 Vancomycin 5/10>>5/18 Micafungin 5/17>>>5/18  Cultures: Blood 5/10>>neg Urine 5/10>>neg Lt knee 5/11>>neg Sputum 5/14>>> neg   Lab 12/05/11 0400 12/04/11 0440 12/03/11 0406  WBC 14.7* 14.6* 16.0*   A: Leukocytosis- likely due to high dose steroids.  Trending down, has had low grade fevers. UA 5/22 small LE, nit negative P: -f/u VATS biopsy/cultures -follow CXR -sputum culture  Endocrine: A: Steroid induced hyperglycemia P: - monitor CBG/SSI  A: Mild elevation in TSH P: - Will need further assessment as outpt.  BEST PRACTICE / DISPOSITION - Level of Care:  SDU  - Primary Service: PCCM - Consultants: CVTS, ID - Code Status:  Full - Diet:  regular - DVT Px: Lovenox - GI Px:  protonix      Levy Pupa, MD, PhD 12/05/2011, 8:22 AM [facility] [phone] or if no answer  [phone]

## 2011-12-05 NOTE — Progress Notes (Signed)
Pt reports poor appetite, refuses to eat. Pt thinks poor appetite could be d/t prednisone. RN left note for MD. RN will continue to monitor, encourage fluid and food intake, and provide emotional support.

## 2011-12-05 NOTE — Progress Notes (Signed)
Utilization review completed.  

## 2011-12-05 NOTE — Progress Notes (Signed)
Central line d/c per MD order. Pt's vital sign stable, no shortness of breath. Pt educated on notifying RN on any development of shortness of breath, dizziness, and any other changes from normal.

## 2011-12-06 DIAGNOSIS — B370 Candidal stomatitis: Secondary | ICD-10-CM

## 2011-12-06 DIAGNOSIS — B3781 Candidal esophagitis: Secondary | ICD-10-CM

## 2011-12-06 LAB — GLUCOSE, CAPILLARY
Glucose-Capillary: 152 mg/dL — ABNORMAL HIGH (ref 70–99)
Glucose-Capillary: 148 mg/dL — ABNORMAL HIGH (ref 70–99)

## 2011-12-06 MED ORDER — DM-GUAIFENESIN ER 30-600 MG PO TB12
1.0000 | ORAL_TABLET | Freq: Two times a day (BID) | ORAL | Status: DC
  Administered 2011-12-06 – 2011-12-10 (×8): 1 via ORAL
  Filled 2011-12-06 (×10): qty 1

## 2011-12-06 MED ORDER — FLUCONAZOLE 150 MG PO TABS
150.0000 mg | ORAL_TABLET | Freq: Every day | ORAL | Status: AC
  Administered 2011-12-06 – 2011-12-08 (×3): 150 mg via ORAL
  Filled 2011-12-06 (×3): qty 1

## 2011-12-06 NOTE — Progress Notes (Signed)
PCCM Progress Note  HISTORY of PRESENT ILLNESS:  Robert Mcgee is a 71 y.o. male admitted on 11/21/2011 with fever, polyarthralgia, and found to have multiple pulmonary nodules.  PCCM consulted 5/13 to assess progressive b/l pulmonary infiltrates, dyspnea, and hypoxia.  PET/CT with diffuse uptake.  Started on high dose steroids and abx upon admission.   Tests/events 03/13/11 CT abd/pelvis>>8 mm RML nodule 11/17/11 CT chest>>borderline hilar/mediastinal adenopathy, b/l rounded nodules of varying size up to 2.6 cm 11/17/11 CT abd/pelvis>>abnormal soft tissue surrounding bifurcation of abdominal aorta 11/21/11 Scrotal u/s>>no evidence of testicular masses 11/24/11 PET scan>>numerous b/l pulmonary nodules with SUV up to 12.1, SUV 6.2 in abnormal soft tissue around abdominal aorta, SUV 6.1 Lt prostate   11/22/11 Echo>>mild LVH, EF 65 to 70%, grade 1 diastolic dysfx, mild AS, mild LA/RA dilation, PAS 42 mmHg 5/16: Tx to [facility] for VATS 5/17: VATS 5/18: Extubated, C- ANCA 1:640 5/23 cytoxan started  Subjective/ Overnight:  Wife at bedside Started cytoxan 5/23 Bothered by persistent cough productive of thick, blood tinged mucus, onset around time of VATS.  Bothered by anorexia, sore throat, nothing tastes right. Myalgias are milder. Note PT, OT, nutrition recs  Temp:  [97.5 F (36.4 C)-99.3 F (37.4 C)] 99.3 F (37.4 C) (05/25 0815) Pulse Rate:  [64-78] 78  (05/25 0400) Cardiac Rhythm:  [-] Normal sinus rhythm (05/25 0900) Resp:  [19-78] 78  (05/25 0400) BP: (94-130)/(51-58) 130/56 mmHg (05/25 0400) SpO2:  [90 %-94 %] 94 % (05/25 0400)   I/O last 3 completed shifts: In: 2469 [P.O.:240; I.V.:2055; IV Piggyback:174] Out: 1350 [Urine:1350]  Intake/Output Summary (Last 24 hours) at 12/06/11 0936 Last data filed at 12/06/11 0400  Gross per 24 hour  Intake   1183 ml  Output   1000 ml  Net    183 ml     Physical exam: General - elderly male in no acute distress HEENT - PERRLA,  Thrush Chest - resps even non labored, diminished bases, bilateral posterior crackles, rattling cough/ frequent Yankauer suctioning Cardiac - s1s2 regular, no murmur, 2 cm JVD at 45 degrees Abd - soft, nontender +bowel sounds Ext - no  edema   Neuro - GCS 15, no focal deficits Psych - normal mood, behavior Skin - no rashes  No results found. VATS path still pending  CBC    Component Value Date/Time   WBC 14.7* 12/05/2011 0400   RBC 3.42* 12/05/2011 0400   HGB 10.2* 12/05/2011 0400   HCT 31.1* 12/05/2011 0400   PLT 506* 12/05/2011 0400   MCV 90.9 12/05/2011 0400   MCH 29.8 12/05/2011 0400   MCHC 32.8 12/05/2011 0400   RDW 16.7* 12/05/2011 0400   LYMPHSABS 1.6 12/05/2011 0400   MONOABS 0.7 12/05/2011 0400   EOSABS 0.1 12/05/2011 0400   BASOSABS 0.0 12/05/2011 0400    BMET    Component Value Date/Time   NA 139 12/05/2011 0400   K 3.4* 12/05/2011 0400   CL 105 12/05/2011 0400   CO2 25 12/05/2011 0400   GLUCOSE 110* 12/05/2011 0400   BUN 22 12/05/2011 0400   CREATININE 0.83 12/05/2011 0400   CALCIUM 8.1* 12/05/2011 0400   GFRNONAA 86* 12/05/2011 0400   GFRAA >90 12/05/2011 0400   Medications reviewed ASSESSMENT/PLAN:   Pulmonary: ANCA>>>POS  1: 640   ANA>>>neg CEA>>>neg RF>>>18 RMSF>>>neg  A: Acute hypoxic respiratory failure with progressive b/l pulmonary infiltrates with more peripheral distribution.  S/p VATS 5/17.  This began with sinusitis, and has  been associated with symmetric b/l lower extremity polyarthralgia.  He required prednisone therapy to treat sinus infection in Fall 2012. 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  - F/u CXR 5/25 -continue supplemental oxygen to keep sats >92%  -Pulmonary hygiene  -follow final path  results -cont BD -aggressive pulm hygiene  -PT/OT -CXR  Cardiac: ECHO: EF 65%, grade I diastolic dysfunction and PAP of 42.  A: Hypertension-resolved P: - Monitor   Renal:  5/16 UA: Hyaline casts 5/20 UA: Moderate leukocytes, large Hb BMET reviewed   A: Ureteral stone  - Seen by urology. - F/u with urology as outpt.  Musculoskeletal: Lt knee aspiration 5/11>>neg  A: Joint pain, getting better  P: - Continue pain meds -pt/ot  Gastro-intestinal:  A: Thrush Will start with diflucan for quick response, bu may need maintenance control while on these meds.  A: Constipation, Mouth ulcers, abnormal soft tissue swelling around abdominal aorta on CT and ?infiltrative process throughout spleen.  ?lymphoma.  -Continue bowel regimen -Monitor oral exam -?if he needs bx of abdominal mass > appears to be smaller on f/u CT scan after pred, can likely defer for now but he will need f/u imaging in a few months  A: protein calorie malnutrition - nutrition consult for supplements >> Ensure Complete ordered TID - anorexia may relate to thrush ID:  Antibiotics: Doxycycline 5/10>>5/11 Zosyn 5/10>>5/18 Vancomycin 5/10>>5/18 Micafungin 5/17>>>5/18  Cultures: Blood 5/10>>neg Urine 5/10>>neg Lt knee 5/11>>neg Sputum 5/14>>> neg   Lab 12/05/11 0400 12/04/11 0440 12/03/11 0406  WBC 14.7* 14.6* 16.0*   A: Leukocytosis- likely due to high dose steroids.  Trending down, has had low grade fevers. UA 5/22 small LE, nit negative P: -f/u VATS biopsy/cultures -follow CXR -sputum culture  Endocrine: A: Steroid induced hyperglycemia  CBG (last 3)   Basename 12/06/11 0758 12/05/11 2153 12/05/11 1653  GLUCAP 118* 102* 169*    P: - monitor CBG/SSI  A: Mild elevation in TSH P: - Will need further assessment as outpt.  BEST PRACTICE / DISPOSITION - Level of Care:  SDU  - Primary Service: PCCM - Consultants: CVTS, ID - Code Status:  Full - Diet:  regular - DVT Px:  Lovenox - GI Px:  protonix     CD Payam Gribble, MD [facility]

## 2011-12-06 NOTE — Progress Notes (Signed)
Pt eating very poor and he did not want to drink the supplements but he would take the ice 's  And he did well with this,he also was asked to walk today but he did not want to at this time will offer again later

## 2011-12-07 ENCOUNTER — Inpatient Hospital Stay (HOSPITAL_COMMUNITY): Payer: Medicare Other

## 2011-12-07 LAB — GLUCOSE, CAPILLARY

## 2011-12-07 MED ORDER — POTASSIUM CHLORIDE CRYS ER 10 MEQ PO TBCR
10.0000 meq | EXTENDED_RELEASE_TABLET | Freq: Every day | ORAL | Status: DC
  Administered 2011-12-07 – 2011-12-09 (×3): 10 meq via ORAL
  Filled 2011-12-07 (×3): qty 1

## 2011-12-07 NOTE — Progress Notes (Signed)
Patient ambulated in hallway on RA, tolerated well, will continue to monitor.

## 2011-12-07 NOTE — Progress Notes (Signed)
PCCM Progress Note  HISTORY of PRESENT ILLNESS:  Robert Mcgee is a 71 y.o. male admitted on 11/21/2011 with fever, polyarthralgia, and found to have multiple pulmonary nodules.  PCCM consulted 5/13 to assess progressive b/l pulmonary infiltrates, dyspnea, and hypoxia.  PET/CT with diffuse uptake.  Started on high dose steroids and abx upon admission.   Tests/events 03/13/11 CT abd/pelvis>>8 mm RML nodule 11/17/11 CT chest>>borderline hilar/mediastinal adenopathy, b/l rounded nodules of varying size up to 2.6 cm 11/17/11 CT abd/pelvis>>abnormal soft tissue surrounding bifurcation of abdominal aorta 11/21/11 Scrotal u/s>>no evidence of testicular masses 11/24/11 PET scan>>numerous b/l pulmonary nodules with SUV up to 12.1, SUV 6.2 in abnormal soft tissue around abdominal aorta, SUV 6.1 Lt prostate   11/22/11 Echo>>mild LVH, EF 65 to 70%, grade 1 diastolic dysfx, mild AS, mild LA/RA dilation, PAS 42 mmHg 5/16: Tx to [facility] for VATS 5/17: VATS 5/18: Extubated, C- ANCA 1:640 5/23 cytoxan started  Subjective/ Overnight:  Wife at bedside. His mood and appetite are better. Loose BM today. Started cytoxan 5/23 Less cough/ phlegm  Bothered by anorexia, sore throat, nothing tastes right.- improving on diflucan Myalgias are milder. Hurt right shoulder some pulling up pants. Note PT, OT, nutrition recs  Temp:  [97.1 F (36.2 C)-98.6 F (37 C)] 97.1 F (36.2 C) (05/26 0300) Pulse Rate:  [60-67] 60  (05/26 0400) Cardiac Rhythm:  [-] Normal sinus rhythm (05/26 0400) Resp:  [13-25] 22  (05/26 0400) BP: (101-123)/(58-70) 123/58 mmHg (05/26 0300) SpO2:  [87 %-92 %] 92 % (05/26 0400)   I/O last 3 completed shifts: In: 2623.8 [I.V.:2623.8] Out: 1800 [Urine:1800]  Intake/Output Summary (Last 24 hours) at 12/07/11 0956 Last data filed at 12/07/11 0700  Gross per 24 hour  Intake 2323.75 ml  Output   1250 ml  Net 1073.75 ml     Physical exam: General - elderly male in no acute distress.  Up in chair HEENT - PERRLA, Thrush Chest - resps even non labored, diminished bases, bilateral posterior crackles, not coughing Cardiac - s1s2 regular, no murmur, 2 cm JVD at 45 degrees Abd - soft, nontender faint bowel sounds Ext - no  edema   Neuro - GCS 15, no focal deficits Psych - normal mood, behavior. More optimistic Skin - no rashes. Color good  Dg Chest Port 1 View  12/07/2011  *RADIOLOGY REPORT*  Clinical Data: Pulmonary infiltrates and question Wegener's.  PORTABLE CHEST - 1 VIEW  Comparison: 12/04/2011  Findings: Single view of the chest was obtained.  The right jugular central line has been removed.  Minimal change in the patchy bilateral airspace densities.  Heart size is stable.  No evidence for pneumothorax.  IMPRESSION: Minimal change in the patchy bilateral airspace disease.  Original Report Authenticated By: Richarda Overlie, M.D.   VATS- path still pending  CBC    Component Value Date/Time   WBC 14.7* 12/05/2011 0400   RBC 3.42* 12/05/2011 0400   HGB 10.2* 12/05/2011 0400   HCT 31.1* 12/05/2011 0400   PLT 506* 12/05/2011 0400   MCV 90.9 12/05/2011 0400   MCH 29.8 12/05/2011 0400   MCHC 32.8 12/05/2011 0400   RDW 16.7* 12/05/2011 0400   LYMPHSABS 1.6 12/05/2011 0400   MONOABS 0.7 12/05/2011 0400   EOSABS 0.1 12/05/2011 0400   BASOSABS 0.0 12/05/2011 0400    BMET    Component Value Date/Time   NA 139 12/05/2011 0400   K 3.4* 12/05/2011 0400   CL 105 12/05/2011 0400  CO2 25 12/05/2011 0400   GLUCOSE 110* 12/05/2011 0400   BUN 22 12/05/2011 0400   CREATININE 0.83 12/05/2011 0400   CALCIUM 8.1* 12/05/2011 0400   GFRNONAA 86* 12/05/2011 0400   GFRAA >90 12/05/2011 0400   Medications reviewed ASSESSMENT/PLAN:   Pulmonary: ANCA>>>POS  1: 640   ANA>>>neg CEA>>>neg RF>>>18 RMSF>>>neg  A: Acute hypoxic respiratory failure with progressive b/l pulmonary infiltrates with more peripheral distribution.  S/p VATS 5/17.  This began with sinusitis, and has been associated with symmetric  b/l lower extremity polyarthralgia.  He required prednisone therapy to treat sinus infection in Fall 2012. 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  - F/u CXR 5/25 -continue supplemental oxygen to keep sats > 88% as ordered -Pulmonary hygiene  -follow final path results -cont BD -aggressive pulm hygiene  -PT/OT -CXR  Cardiac: ECHO: EF 65%, grade I diastolic dysfunction and PAP of 42.  A: Hypertension-resolved P: - Monitor   Renal:  5/16 UA: Hyaline casts 5/20 UA: Moderate leukocytes, large Hb BMET reviewed P: Update U/A  A: Ureteral stone  - Seen by urology. - F/u with urology as outpt.  Musculoskeletal: Lt knee aspiration 5/11>>neg  A: Joint pain, getting better  P: -R shoulder pain  - Continue pain meds -pt/ot  Gastro-intestinal: Loose BM 5/26 A: Thrush Will start with diflucan for quick response, bu may need maintenance control while on these meds.  A: Constipation, Mouth ulcers, abnormal soft tissue swelling around abdominal aorta on CT and ?infiltrative process throughout spleen.  ?lymphoma.  -Continue bowel regimen -Monitor oral exam -?if he needs bx of abdominal mass > appears to be smaller on f/u CT scan after pred, can likely defer for now but he will need f/u imaging in a few months  A: protein calorie malnutrition - nutrition consult for supplements >> Ensure Complete ordered TID - anorexia may relate to thrush -K 3.4-> add oral potassium P: Regular diet, oral potassium ID:  Antibiotics: Doxycycline 5/10>>5/11 Zosyn 5/10>>5/18 Vancomycin 5/10>>5/18 Micafungin 5/17>>>5/18  Cultures: Blood 5/10>>neg Urine 5/10>>neg Lt knee 5/11>>neg Sputum 5/14>>> neg   Lab 12/05/11 0400 12/04/11 0440 12/03/11 0406  WBC  14.7* 14.6* 16.0*   A: Leukocytosis- likely due to high dose steroids.  Trending down, has had low grade fevers. UA 5/22 small LE, nit negative P: -f/u VATS biopsy/cultures -follow CXR -sputum culture  Endocrine: A: Steroid induced hyperglycemia  CBG (last 3)   Basename 12/07/11 0753 12/06/11 2146 12/06/11 1414  GLUCAP 127* 148* 152*    P: - monitor CBG/SSI  A: Mild elevation in TSH P: - Will need further assessment as outpt.  BEST PRACTICE / DISPOSITION - Level of Care:  SDU  - Primary Service: PCCM - Consultants: CVTS, ID - Code Status:  Full - Diet:  regular - DVT Px: Lovenox - GI Px:  protonix     CD Bona Hubbard, MD [facility]

## 2011-12-08 LAB — GLUCOSE, CAPILLARY
Glucose-Capillary: 170 mg/dL — ABNORMAL HIGH (ref 70–99)
Glucose-Capillary: 88 mg/dL (ref 70–99)
Glucose-Capillary: 206 mg/dL — ABNORMAL HIGH (ref 70–99)
Glucose-Capillary: 193 mg/dL — ABNORMAL HIGH (ref 70–99)

## 2011-12-08 MED ORDER — INSULIN ASPART 100 UNIT/ML ~~LOC~~ SOLN: 0.0000 [IU] | SUBCUTANEOUS | Status: DC

## 2011-12-08 MED ORDER — INSULIN ASPART 100 UNIT/ML ~~LOC~~ SOLN
0.0000 [IU] | Freq: Every day | SUBCUTANEOUS | Status: DC
Start: 2011-12-08 — End: 2011-12-09

## 2011-12-08 MED ORDER — INSULIN ASPART 100 UNIT/ML ~~LOC~~ SOLN
0.0000 [IU] | Freq: Three times a day (TID) | SUBCUTANEOUS | Status: DC
  Administered 2011-12-08 – 2011-12-09 (×2): 2 [IU] via SUBCUTANEOUS

## 2011-12-08 NOTE — Progress Notes (Signed)
Pt. Being transferred to [id number] via wheelchair. Phone report called to Leotis Shames, RN. Patient's wife present at bedside and is aware of the transfer.

## 2011-12-08 NOTE — Progress Notes (Signed)
PCCM Progress Note  HISTORY of PRESENT ILLNESS:  Robert Mcgee is a 71 y.o. male admitted on 11/21/2011 with fever, polyarthralgia, and found to have multiple pulmonary nodules.  PCCM consulted 5/13 to assess progressive b/l pulmonary infiltrates, dyspnea, and hypoxia.  PET/CT with diffuse uptake.  Started on high dose steroids and abx upon admission.   Tests/events 03/13/11 CT abd/pelvis>>8 mm RML nodule 11/17/11 CT chest>>borderline hilar/mediastinal adenopathy, b/l rounded nodules of varying size up to 2.6 cm 11/17/11 CT abd/pelvis>>abnormal soft tissue surrounding bifurcation of abdominal aorta 11/21/11 Scrotal u/s>>no evidence of testicular masses 11/24/11 PET scan>>numerous b/l pulmonary nodules with SUV up to 12.1, SUV 6.2 in abnormal soft tissue around abdominal aorta, SUV 6.1 Lt prostate   11/22/11 Echo>>mild LVH, EF 65 to 70%, grade 1 diastolic dysfx, mild AS, mild LA/RA dilation, PAS 42 mmHg 5/16: Tx to [facility] for VATS 5/17: VATS 5/18: Extubated, C- ANCA 1:640 5/23 cytoxan started 5/27 transfer from sdu to floor Subjective/ Overnight:  Wife at bedside. His mood and appetite are better.OFF o2  Less cough/ phlegm  Bothered by anorexia, sore throat, nothing tastes right.- improving on diflucan Myalgias are milder. Hurt right shoulder some pulling up pants. Note PT, OT, nutrition recs  Temp:  [97.5 F (36.4 C)-98.7 F (37.1 C)] 97.9 F (36.6 C) (05/27 0800) Pulse Rate:  [56-66] 56  (05/27 0400) Cardiac Rhythm:  [-] Normal sinus rhythm (05/27 0802) Resp:  [14-21] 16  (05/27 0400) BP: (112-125)/(58-70) 112/59 mmHg (05/27 0700) SpO2:  [89 %-91 %] 91 % (05/27 0400)   I/O last 3 completed shifts: In: 4623.8 [P.O.:500; I.V.:4123.8] Out: 1675 [Urine:1675]  Intake/Output Summary (Last 24 hours) at 12/08/11 0931 Last data filed at 12/08/11 0700  Gross per 24 hour  Intake   1975 ml  Output   1125 ml  Net    850 ml     Physical exam: General - elderly male in no acute  distress. walking HEENT - PERRLA, Thrush Chest - resps even non labored, diminished bases, bilateral posterior crackles, not coughing Cardiac - s1s2 regular, no murmur, 2 cm JVD at 45 degrees Abd - soft, nontender faint bowel sounds Ext - no  edema   Neuro - GCS 15, no focal deficits Psych - normal mood, behavior. More optimistic Skin - no rashes. Color good  Dg Chest Port 1 View  12/07/2011  *RADIOLOGY REPORT*  Clinical Data: Pulmonary infiltrates and question Wegener's.  PORTABLE CHEST - 1 VIEW  Comparison: 12/04/2011  Findings: Single view of the chest was obtained.  The right jugular central line has been removed.  Minimal change in the patchy bilateral airspace densities.  Heart size is stable.  No evidence for pneumothorax.  IMPRESSION: Minimal change in the patchy bilateral airspace disease.  Original Report Authenticated By: Richarda Overlie, M.D.   VATS- path still pending  CBC    Component Value Date/Time   WBC 14.7* 12/05/2011 0400   RBC 3.42* 12/05/2011 0400   HGB 10.2* 12/05/2011 0400   HCT 31.1* 12/05/2011 0400   PLT 506* 12/05/2011 0400   MCV 90.9 12/05/2011 0400   MCH 29.8 12/05/2011 0400   MCHC 32.8 12/05/2011 0400   RDW 16.7* 12/05/2011 0400   LYMPHSABS 1.6 12/05/2011 0400   MONOABS 0.7 12/05/2011 0400   EOSABS 0.1 12/05/2011 0400   BASOSABS 0.0 12/05/2011 0400    BMET    Component Value Date/Time   NA 139 12/05/2011 0400   K 3.4* 12/05/2011 0400  CL 105 12/05/2011 0400   CO2 25 12/05/2011 0400   GLUCOSE 110* 12/05/2011 0400   BUN 22 12/05/2011 0400   CREATININE 0.83 12/05/2011 0400   CALCIUM 8.1* 12/05/2011 0400   GFRNONAA 86* 12/05/2011 0400   GFRAA >90 12/05/2011 0400   Medications reviewed ASSESSMENT/PLAN:   Pulmonary: ANCA>>>POS  1: 640   ANA>>>neg CEA>>>neg RF>>>18 RMSF>>>neg  A: Acute hypoxic respiratory failure with progressive b/l pulmonary infiltrates with more peripheral distribution.  S/p VATS 5/17.  This began with sinusitis, and has been associated with  symmetric b/l lower extremity polyarthralgia.  He required prednisone therapy to treat sinus infection in Fall 2012. 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  - F/u CXR 5/25 -continue supplemental oxygen to keep sats > 88% as ordered -Pulmonary hygiene  -follow final path results -cont BD -aggressive pulm hygiene  -PT/OT -CXR  Cardiac: ECHO: EF 65%, grade I diastolic dysfunction and PAP of 42.  A: Hypertension-resolved P: - Monitor   Renal:  5/16 UA: Hyaline casts 5/20 UA: Moderate leukocytes, large Hb BMET reviewed P: Update U/A  A: Ureteral stone  - Seen by urology. - F/u with urology as outpt.  Musculoskeletal: Lt knee aspiration 5/11>>neg  A: Joint pain, getting better  P: -R shoulder pain  - Continue pain meds -pt/ot  Gastro-intestinal: Loose BM 5/26 A: Thrush Will start with diflucan for quick response, bu may need maintenance control while on these meds.  A: Constipation, Mouth ulcers, abnormal soft tissue swelling around abdominal aorta on CT and ?infiltrative process throughout spleen.  ?lymphoma.  -Continue bowel regimen -Monitor oral exam -?if he needs bx of abdominal mass > appears to be smaller on f/u CT scan after pred, can likely defer for now but he will need f/u imaging in a few months  A: protein calorie malnutrition - nutrition consult for supplements >> Ensure Complete ordered TID - anorexia may relate to thrush -K 3.4-> add oral potassium P: Regular diet, oral potassium ID:  Antibiotics: Doxycycline 5/10>>5/11 Zosyn 5/10>>5/18 Vancomycin 5/10>>5/18 Micafungin 5/17>>>5/18 5/25 diflucan>>5/28 Cultures: Blood 5/10>>neg Urine 5/10>>neg Lt knee 5/11>>neg Sputum 5/14>>> neg   Lab 12/05/11 0400  12/04/11 0440 12/03/11 0406  WBC 14.7* 14.6* 16.0*   A: Leukocytosis- likely due to high dose steroids.  Trending down, has had low grade fevers. UA 5/22 small LE, nit negative P: -f/u VATS biopsy/cultures -follow CXR -sputum culture  Endocrine: A: Steroid induced hyperglycemia  CBG (last 3)   Basename 12/08/11 0803 12/07/11 2150 12/07/11 1724  GLUCAP 88 170* 229*    P: - monitor CBG/SSI  A: Mild elevation in TSH P: - Will need further assessment as outpt.  BEST PRACTICE / DISPOSITION - Level of Care:  SDU 5/27 to floor - Primary Service: PCCM - Consultants: CVTS, ID - Code Status:  Full - Diet:  regular - DVT Px: Lovenox - GI Px:  protonix   Brett Canales Minor ACNP Adolph Pollack PCCM Pager [phone] till 3 pm If no answer page [phone] 12/08/2011, 9:31 AM  Continue cytoxan and f/u on blood work.  Will need f/u imaging and additional rehab prior to discharge and f/u.    Patient seen and examined, agree with above note.  I dictated the care and orders written for this patient under my direction.  Also keep as dry as tolerated.  Koren Bound, M.D. [phone]

## 2011-12-08 NOTE — Progress Notes (Signed)
Physical Therapy Treatment Patient Details Name: Robert Mcgee MRN: [id number] DOB: 08/07/1940 Today's Date: 12/08/2011 Time: 1040-1056 PT Time Calculation (min): 16 min  PT Assessment / Plan / Recommendation Comments on Treatment Session  Patient getting ready to transfer to regular floor. Patient has been ambulating daily and is progressing well. Would like to attempt ambulation without RW next session    Follow Up Recommendations  Home health PT;Supervision - Intermittent    Barriers to Discharge        Equipment Recommendations  Rolling walker with 5" wheels;3 in 1 bedside comode    Recommendations for Other Services    Frequency Min 3X/week   Plan Discharge plan remains appropriate    Precautions / Restrictions Precautions Precautions: Fall   Pertinent Vitals/Pain Patient complains of shoulder pain in L arm that happened earlier as he went to put on his pants.     Mobility  Transfers Sit to Stand: 6: Modified independent (Device/Increase time) Stand to Sit: 6: Modified independent (Device/Increase time) Ambulation/Gait Ambulation/Gait Assistance: 5: Supervision Ambulation Distance (Feet): 250 Feet Assistive device: Rolling walker Ambulation/Gait Assistance Details: Cues for breathing and posture. Pts O2 decreased to 87% on RA. Patient recovers quickly with short standing rest break Gait Pattern: Step-through pattern;Decreased stride length    Exercises     PT Diagnosis:    PT Problem List:   PT Treatment Interventions:     PT Goals Acute Rehab PT Goals PT Goal: Sit to Stand - Progress: Progressing toward goal PT Goal: Stand to Sit - Progress: Progressing toward goal PT Goal: Ambulate - Progress: Progressing toward goal  Visit Information  Last PT Received On: 12/08/11 Assistance Needed: +1    Subjective Data      Cognition  Overall Cognitive Status: Appears within functional limits for tasks assessed/performed Arousal/Alertness: Awake/alert Orientation  Level: Appears intact for tasks assessed Behavior During Session: [facility] for tasks performed    Balance     End of Session PT - End of Session Equipment Utilized During Treatment: Gait belt Activity Tolerance: Patient tolerated treatment well Patient left: in chair;with family/visitor present Nurse Communication: Mobility status    Fredrich Birks 12/08/2011, 12:03 PM 12/08/2011 Fredrich Birks PTA [phone] pager [phone] office

## 2011-12-09 ENCOUNTER — Telehealth: Payer: Self-pay | Admitting: Pulmonary Disease

## 2011-12-09 DIAGNOSIS — M3130 Wegener's granulomatosis without renal involvement: Principal | ICD-10-CM | POA: Diagnosis present

## 2011-12-09 LAB — BASIC METABOLIC PANEL
BUN: 12 mg/dL (ref 6–23)
CO2: 23 mEq/L (ref 19–32)
Calcium: 8.1 mg/dL — ABNORMAL LOW (ref 8.4–10.5)
Chloride: 103 mEq/L (ref 96–112)
Creatinine, Ser: 0.8 mg/dL (ref 0.50–1.35)
GFR calc Af Amer: >90 mL/min (ref >90)
GFR calc non Af Amer: 88 mL/min — ABNORMAL LOW (ref >90)
Glucose, Bld: 110 mg/dL — ABNORMAL HIGH (ref 70–99)
Potassium: 3.7 mEq/L (ref 3.5–5.1)
Sodium: 136 mEq/L (ref 135–145)

## 2011-12-09 LAB — GLUCOSE, CAPILLARY
Glucose-Capillary: 90 mg/dL (ref 70–99)
Glucose-Capillary: 181 mg/dL — ABNORMAL HIGH (ref 70–99)
Glucose-Capillary: 270 mg/dL — ABNORMAL HIGH (ref 70–99)
Glucose-Capillary: 149 mg/dL — ABNORMAL HIGH (ref 70–99)

## 2011-12-09 LAB — CBC
HCT: 30.2 % — ABNORMAL LOW (ref 39.0–52.0)
Hemoglobin: 9.8 g/dL — ABNORMAL LOW (ref 13.0–17.0)
MCH: 29.3 pg (ref 26.0–34.0)
MCHC: 32.5 g/dL (ref 30.0–36.0)
MCV: 90.4 fL (ref 78.0–100.0)
Platelets: 454 10*3/uL — ABNORMAL HIGH (ref 150–400)
RBC: 3.34 MIL/uL — ABNORMAL LOW (ref 4.22–5.81)
RDW: 17.1 % — ABNORMAL HIGH (ref 11.5–15.5)
WBC: 12.3 10*3/uL — ABNORMAL HIGH (ref 4.0–10.5)

## 2011-12-09 MED ORDER — PREDNISONE 20 MG PO TABS: 60.0000 mg | ORAL_TABLET | Freq: Every day | ORAL | Status: AC

## 2011-12-09 MED ORDER — CYCLOPHOSPHAMIDE 50 MG PO TABS: 150.0000 mg | ORAL_TABLET | Freq: Every day | ORAL | Status: AC

## 2011-12-09 MED ORDER — METFORMIN HCL 500 MG PO TABS: ORAL_TABLET | ORAL | Status: DC

## 2011-12-09 MED ORDER — DM-GUAIFENESIN ER 30-600 MG PO TB12: 1.0000 | ORAL_TABLET | Freq: Two times a day (BID) | ORAL | Status: AC

## 2011-12-09 MED ORDER — POLYETHYLENE GLYCOL 3350 17 G PO PACK: 17.0000 g | PACK | Freq: Every day | ORAL | Status: AC

## 2011-12-09 MED ORDER — INSULIN ASPART 100 UNIT/ML ~~LOC~~ SOLN
5.0000 [IU] | Freq: Once | SUBCUTANEOUS | Status: AC
  Administered 2011-12-09: 5 [IU] via SUBCUTANEOUS

## 2011-12-09 MED ORDER — MAGIC MOUTHWASH: 10.0000 mL | ORAL | Status: DC | PRN

## 2011-12-09 MED ORDER — MAGIC MOUTHWASH
10.0000 mL | ORAL | Status: DC | PRN
  Filled 2011-12-09: qty 10

## 2011-12-09 MED ORDER — SENNA 8.6 MG PO TABS: 1.0000 | ORAL_TABLET | Freq: Two times a day (BID) | ORAL | Status: DC

## 2011-12-09 MED ORDER — ONDANSETRON HCL 4 MG PO TABS: 4.0000 mg | ORAL_TABLET | Freq: Three times a day (TID) | ORAL | Status: DC | PRN

## 2011-12-09 MED ORDER — POTASSIUM CHLORIDE CRYS ER 10 MEQ PO TBCR: 10.0000 meq | EXTENDED_RELEASE_TABLET | Freq: Every day | ORAL | Status: DC

## 2011-12-09 MED ORDER — METFORMIN HCL 500 MG PO TABS
500.0000 mg | ORAL_TABLET | Freq: Every day | ORAL | Status: DC
  Administered 2011-12-09: 500 mg via ORAL
  Filled 2011-12-09 (×2): qty 1

## 2011-12-09 NOTE — Progress Notes (Signed)
Physical Therapy Treatment Patient Details Name: Robert Mcgee MRN: [id number] DOB: 08/23/1940 Today's Date: 12/09/2011 Time: 1435-1450 PT Time Calculation (min): 15 min  PT Assessment / Plan / Recommendation Comments on Treatment Session  Robert Mcgee is 71 y/o male admitted with hypoxia. Has made great progress with therapy and is now ambulating without assistive device however still with mingaurdA and supervision and still needing frequent rest breaks to recover his breath. Attempted to begin exercise program today with patient but he is too fatigued. He was able to complete 2 sets of exercises but became short of breath. Deferred exercise program education till next visit, if pt discharges prior to my next visit he will f/u with HHPT for exercise program.    Patient also with new onset left shoulder pain. Painful with external rotation and active shoulder flexion. Gave pt AAROM exercises (shoulder flexion and external rotation in supine within a pain free range) as well as postural exercises (scapular squeezes and chin tucks). Educated pt on RICE and advised pt to follow up with outpatient physical therapy if his shoulder pain does not resolve with rest.     Follow Up Recommendations  Home health PT;Supervision - Intermittent    Barriers to Discharge        Equipment Recommendations  None recommended by PT    Recommendations for Other Services    Frequency Min 3X/week   Plan Discharge plan remains appropriate;Frequency remains appropriate    Precautions / Restrictions Precautions Precautions: Fall       Mobility  Bed Mobility Bed Mobility: Supine to Sit;Sit to Supine Supine to Sit: 6: Modified independent (Device/Increase time) Sit to Supine: 6: Modified independent (Device/Increase time) Transfers Sit to Stand: 6: Modified independent (Device/Increase time) Stand to Sit: 6: Modified independent (Device/Increase time) Ambulation/Gait Ambulation/Gait Assistance: 5: Supervision;4:  Min guard Ambulation Distance (Feet): 275 Feet Assistive device: None Ambulation/Gait Assistance Details: pt ambulates with decreased trunk rotation but good reciprocal arms swing, still with quick fatigue and needing education concerning rest breaks as he feels he is getting winded Gait Pattern: Trunk flexed    Exercises General Exercises - Lower Extremity Long Arc Quad: AROM;Both;5 reps;Seated Hip Flexion/Marching: AROM;Both;5 reps;Standing     PT Goals Acute Rehab PT Goals PT Goal: Sit to Supine/Side - Progress: Partly met PT Goal: Sit to Stand - Progress: Partly met PT Goal: Stand to Sit - Progress: Partly met PT Goal: Ambulate - Progress: Progressing toward goal  Visit Information  Last PT Received On: 12/09/11 Assistance Needed: +1    Subjective Data  Subjective: I've walked about 3 times today and taken a shower.    Cognition  Overall Cognitive Status: Appears within functional limits for tasks assessed/performed Arousal/Alertness: Awake/alert Orientation Level: Appears intact for tasks assessed Behavior During Session: [facility] for tasks performed    Balance     End of Session PT - End of Session Equipment Utilized During Treatment: Gait belt Activity Tolerance: Patient limited by fatigue Patient left: in bed;with call bell/phone within reach;with family/visitor present    [facility] Robert Mcgee 12/09/2011, 3:09 PM

## 2011-12-09 NOTE — Progress Notes (Signed)
Nutrition Follow-up  Diet Order:  Regular  Pt PO intake has improved to 30-50% of meals.  Pt is also consuming frequent beverages and snacks such as apple juice, gatorade, PB&J sandwiches, and cookies.  Pt states that this thrush has improved and is no longer limiting intake.  Largest barrier is decreased appetite which pt attributed to decreased activity.  Pt was able to get out of bed yesterday and anticipates will get up after lunch.  Pt reports constipation for which he is taking stool softeners and laxatives.  Encouraged activity for improvement in appetite and bowel function. Pt continues cytoxan daily.  Ensure being refused- will d/c.  Meds: Scheduled Meds:   . cyclophosphamide  150 mg Oral Daily  . dextromethorphan-guaiFENesin  1 tablet Oral BID  . enoxaparin (LOVENOX) injection  40 mg Subcutaneous Q24H  . feeding supplement  237 mL Oral TID BM  . insulin aspart  0-5 Units Subcutaneous QHS  . insulin aspart  0-9 Units Subcutaneous TID WC  . pantoprazole  40 mg Oral Q1200  . polyethylene glycol  17 g Oral Daily  . potassium chloride  10 mEq Oral Daily  . predniSONE  60 mg Oral Q breakfast  . senna  1 tablet Oral BID  . DISCONTD: insulin aspart  0-4 Units Subcutaneous Q4H  . DISCONTD: insulin aspart  0-5 Units Subcutaneous QHS  . DISCONTD: insulin aspart  0-9 Units Subcutaneous TID WC   Continuous Infusions:  PRN Meds:.acetaminophen, albuterol, ipratropium, menthol-cetylpyridinium, morphine injection, ondansetron (ZOFRAN) IV, ondansetron, oxyCODONE-acetaminophen, potassium chloride, sodium chloride, traMADol  Labs:  CMP     Component Value Date/Time   NA 136 12/09/2011 0440   K 3.7 12/09/2011 0440   CL 103 12/09/2011 0440   CO2 23 12/09/2011 0440   GLUCOSE 110* 12/09/2011 0440   BUN 12 12/09/2011 0440   CREATININE 0.80 12/09/2011 0440   CALCIUM 8.1* 12/09/2011 0440   PROT 5.8* 11/30/2011 0500   ALBUMIN 1.8* 11/30/2011 0500   AST 15 11/30/2011 0500   ALT 24 11/30/2011 0500   ALKPHOS 132* 11/30/2011 0500   BILITOT 0.4 11/30/2011 0500   GFRNONAA 88* 12/09/2011 0440   GFRAA >90 12/09/2011 0440    No intake or output data in the 24 hours ending 12/09/11 1310  Weight Status:  208 lbs Admission wt: 215 lbs  Restatement of needs: 2300-2500 kcal, 120-140 g protein, 2.3-2.5 L/day  Nutrition Dx:   Inadequate oral intake, ongoing Moderate malnutrition of acute illness, ongoing  Intervention:   1.  General healthful diet; pt has been able to improve intake, slowly.  Encouraged pt to set goal for ~50% of meals consistently with snacks between meals.  Will d/c Ensure as pt is refusing it.  Encouraged activity to facilitate bowel movements and improve appetite.   Monitor:   1.  Food/Beverage; pt meeting estimated needs. Not met, continue.  PO intake has improved but remain suboptimal. 2.  Wt/wt change; deter further loss.  Not met, pt has lost 7 lbs since admission likely related in some part to decreased PO intake.   Hoyt Koch Pager #:  [phone]

## 2011-12-09 NOTE — Progress Notes (Signed)
PCCM Progress Note  HISTORY of PRESENT ILLNESS:  Robert Mcgee is a 71 y.o. male admitted on 11/21/2011 with fever, polyarthralgia, and found to have multiple pulmonary nodules.  PCCM consulted 5/13 to assess progressive b/l pulmonary infiltrates, dyspnea, and hypoxia.  PET/CT with diffuse uptake.  Started on high dose steroids and abx upon admission.   Tests/events 03/13/11 CT abd/pelvis>>8 mm RML nodule 11/17/11 CT chest>>borderline hilar/mediastinal adenopathy, b/l rounded nodules of varying size up to 2.6 cm 11/17/11 CT abd/pelvis>>abnormal soft tissue surrounding bifurcation of abdominal aorta 11/21/11 Scrotal u/s>>no evidence of testicular masses 11/24/11 PET scan>>numerous b/l pulmonary nodules with SUV up to 12.1, SUV 6.2 in abnormal soft tissue around abdominal aorta, SUV 6.1 Lt prostate   11/22/11 Echo>>mild LVH, EF 65 to 70%, grade 1 diastolic dysfx, mild AS, mild LA/RA dilation, PAS 42 mmHg 5/16: Tx to [facility] for VATS 5/17: VATS 5/18: Extubated, C- ANCA 1:640 5/23 cytoxan started 5/27 transfer from sdu to floor  Subjective/ Overnight:  Wife at bedside. Sore throat Present but gettting better On d6 of daily cytoxan Ambulated hallway and no desats per PT. Per RN he desaturated overnight. We walked him again in hallway and lowest pulse ox was 92% onRA and he walked without assist. PT cleard him to go home.   Temp:  [97.3 F (36.3 C)-98.1 F (36.7 C)] 97.8 F (36.6 C) (05/28 1325) Pulse Rate:  [57-64] 64  (05/28 1325) Cardiac Rhythm:  [-]  BP: (117-143)/(58-66) 123/66 mmHg (05/28 1325) SpO2:  [92 %-95 %] 95 % (05/28 1420)   I/O last 3 completed shifts: In: 1425 [P.O.:600; I.V.:825] Out: 625 [Urine:625]  Intake/Output Summary (Last 24 hours) at 12/09/11 1529 Last data filed at 12/09/11 1000  Gross per 24 hour  Intake    120 ml  Output      0 ml  Net    120 ml     Physical exam: General - elderly male in no acute distress. walking HEENT - PERRLA, Thrush  cleared. HOARSE VOICE + Chest - resps even non labored, diminished bases, bilateral posterior crackles, not coughing Cardiac - s1s2 regular, no murmur, 2 cm JVD at 45 degrees Abd - soft, nontender faint bowel sounds Ext - no  edema   Neuro - GCS 15, no focal deficits Psych - normal mood, behavior. More optimistic Skin - no rashes. Color good  No results found. VATS- path still pending  CBC    Component Value Date/Time   WBC 12.3* 12/09/2011 0440   RBC 3.34* 12/09/2011 0440   HGB 9.8* 12/09/2011 0440   HCT 30.2* 12/09/2011 0440   PLT 454* 12/09/2011 0440   MCV 90.4 12/09/2011 0440   MCH 29.3 12/09/2011 0440   MCHC 32.5 12/09/2011 0440   RDW 17.1* 12/09/2011 0440   LYMPHSABS 1.6 12/05/2011 0400   MONOABS 0.7 12/05/2011 0400   EOSABS 0.1 12/05/2011 0400   BASOSABS 0.0 12/05/2011 0400    BMET    Component Value Date/Time   NA 136 12/09/2011 0440   K 3.7 12/09/2011 0440   CL 103 12/09/2011 0440   CO2 23 12/09/2011 0440   GLUCOSE 110* 12/09/2011 0440   BUN 12 12/09/2011 0440   CREATININE 0.80 12/09/2011 0440   CALCIUM 8.1* 12/09/2011 0440   GFRNONAA 88* 12/09/2011 0440   GFRAA >90 12/09/2011 0440   Medications reviewed ASSESSMENT/PLAN:   Pulmonary: ANCA>>>POS  1: 640   ANA>>>neg CEA>>>neg RF>>>18 RMSF>>>neg  A: Acute hypoxic respiratory failure with progressive b/l  pulmonary infiltrates with more peripheral distribution.  S/p VATS 5/17.  This began with sinusitis, and has been associated with symmetric b/l lower extremity polyarthralgia.  He required prednisone therapy to treat sinus infection in Fall 2012. 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 Cough is resolved  Plan: - discussed option of going to [facility] for RX but they prefer to follow here with Dr Craige Cotta -completed pulse steroid 05/18 to 05/20; continue prednisone 60 mg daily -cytoxan 150mg  (1.5mg /kg) qd started on 5/23; will need to follow WBC, ANC  and renal fxn on the cytoxan.  - -continue supplemental oxygen to keep sats > 88% as ordered -Pulmonary hygiene -> they want mucinex to go home -follow final path results -cont BD -aggressive pulm hygiene  -PT/OT -CXR  Cardiac: ECHO: EF 65%, grade I diastolic dysfunction and PAP of 42.  A: Hypertension-resolved P: - Monitor   Renal:  5/16 UA: Hyaline casts 5/20 UA: Moderate leukocytes, large Hb BMET reviewed P: Update U/A  A: Ureteral stone  - Seen by urology. - F/u with urology as outpt.  Musculoskeletal: Lt knee aspiration 5/11>>neg  A: Joint pain, getting better  P: -R shoulder pain  - Continue pain meds -pt/ot  Gastro-intestinal: Loose BM 5/26 but normal 5/28 A: Thrush - considered possible etiology for sore throat that developed in hospita; S/p 5 day  difucan Rx ending 5/28 but will dc on mouth magic mouthwash prn Will need to follow closelyl; if not resolved will need ENT eval to see if he has wegner involving hroat  A: Constipation, Mouth ulcers, abnormal soft tissue swelling around abdominal aorta on CT and ?infiltrative process throughout spleen.  ?lymphoma.  -Continue bowel regimen -Monitor oral exam -?if he needs bx of abdominal mass > appears to be smaller on f/u CT scan after pred, can likely defer for now but he will need f/u imaging in a few months  A: protein calorie malnutrition - nutrition consult for supplements >> Ensure Complete ordered TID - anorexia may relate to thrush  P: Regular diet,   ID: Antibiotics: Doxycycline 5/10>>5/11 Zosyn 5/10>>5/18 Vancomycin 5/10>>5/18 Micafungin 5/17>>>5/18 5/25 diflucan>>5/28 Cultures: Blood 5/10>>neg Urine 5/10>>neg Lt knee 5/11>>neg Sputum 5/14>>> neg   Lab 12/09/11 0440 12/05/11 0400 12/04/11 0440  WBC 12.3* 14.7* 14.6*   A: Leukocytosis- likely due to high dose steroids.  Trending down, UA 5/22 small LE, nit negative. AFebrile since 5/23 P: -f/u VATS biopsy/cultures - Endocrine: A:  Steroid induced hyperglycemia  CBG (last 3)   Basename 12/09/11 1211 12/09/11 0743 12/08/11 2151  GLUCAP 181* 90 181*  getting repeated insulin in hospital due to steroids  P:  - start metformin 500mg  twice daily (start once daily first week) - will need opd monitoring  A: Mild elevation in TSH P: - Will need further assessment as outpt.  BEST PRACTICE / DISPOSITION - Level of Care:  SDU 5/27 to floor -> DC home 5/28 with opd fu with Dr Craige Cotta.  -  FU < 1 week with urine analysis, cbc, bmet, phos, mag and CXR and ambulatory pulse ox (d/w Dr Craige Cotta) - Primary Service: PCCM - Consultants: CVTS, ID - Code Status:  Full - Diet:  regular - DVT Px: Lovenox - GI Px:  protonix

## 2011-12-09 NOTE — Discharge Summary (Signed)
Physician Discharge Summary  Patient ID: Robert Mcgee MRN: [id number] DOB/AGE: Mar 12, 1941 71 y.o.  Admit date: 11/21/2011 Discharge date: 12/10/2011    Discharge Diagnoses:  Pulmonary Infiltrates Wegener's Granulomatosis Acute Respiratory Failure Polyarthritis  Weakness generalized Mouth ulcers Night sweats Normocytic anemia Thrush of mouth and esophagus Hypokalemia Hypophosphatemia Steroid Induced Hyperglycemia Left Renal Calculi   Follow up needs in office:   CBC, BMP, Mg, Phos, UA, CXR with Pulmoary.   IF throat remains sore may need ENT referral (may be related to wegener's).   Suture removal from left VATS chest tube with site inspection.   He will need CBC, BMP Q2 weeks while on cytoxan.   Glucose Review with PCP    Brief Summary: Robert Mcgee is a 71 y.o. y/o male with a PMH of kidney stones admitted on 11/21/2011 with fever, polyarthralgia, and found to have multiple pulmonary nodules. Found to have Left renal calculi -no interventions during admit.  PCCM consulted 5/13 to assess progressive bilateral pulmonary infiltrates, dyspnea, and hypoxia. PET/CT with diffuse uptake. Started on high dose steroids and abx upon admission with improvement in symptoms.  Underwent left VATS, open lung biopsy of the left upper lobe and left lower lobe on 5/18.  Pathology results are pending but thought to be Wegener's.  Hospital course complicated by post-operative respiratory failure, steroid induced hyperglycemia, electrolyte disturbances.   Tests/events  03/13/11 CT abd/pelvis>>8 mm RML nodule  11/17/11 CT chest>>borderline hilar/mediastinal adenopathy, b/l rounded nodules of varying size up to 2.6 cm  11/17/11 CT abd/pelvis>>abnormal soft tissue surrounding bifurcation of abdominal aorta  11/21/11 Scrotal u/s>>no evidence of testicular masses  11/24/11 PET scan>>numerous b/l pulmonary nodules with SUV up to 12.1, SUV 6.2 in abnormal soft tissue around abdominal aorta, SUV 6.1 Lt  prostate  11/22/11 Echo>>mild LVH, EF 65 to 70%, grade 1 diastolic dysfx, mild AS, mild LA/RA dilation, PAS 42 mmHg  5/16: Tx to [facility] for VATS  5/17: VATS  5/18: Extubated, C- ANCA 1:640  5/23 cytoxan started  5/27 transfer from sdu to floor      Hospital Course by Discharge Summary  Pulmonary:  ANCA>>>POS 1: 640  ANA>>>neg  CEA>>>neg  RF>>>18  RMSF>>>neg   A: Acute hypoxic respiratory failure with progressive b/l pulmonary infiltrates with more peripheral distribution. S/p VATS 5/17. This has been associated with symmetric b/l lower extremity polyarthralgia. He required prednisone therapy to treat sinus infection in Fall 2012. Positive c-ANCA. Path results pending but this is almost certainly Wegener's  Plan:  -completed pulse steroid 05/18 to 05/20; continue prednisone 60 mg daily  -cytoxan 150 mg (1.5mg /kg) qd started on 5/23; will need to follow WBC, ANC and renal fxn on the cytoxan.  Will Need CBC & BMP every two weeks for monitoring.  -follow final path results    Renal:  5/16 UA: Hyaline casts  5/20 UA: Moderate leukocytes, large Hb   A:  Ureteral stone -7 mm left distal ureteral stone. He has minimal left hydronephrosis, and is not symptomatic. More than likely, the stone has been there quite some time Plan: - Seen by urology and no acute interventions.  Consideration for alpha blockers.   - F/u with urology as outpatient as needed.  - UA at time of office follow up  Hypokalemia / Hypophosphatemia Plan: - continue on K-phos as outpatient - follow up BMP at time of pulmonary follow up  Musculoskeletal:  Lt knee aspiration 5/11>>neg   A: Joint pain, improving  Plan:  - Continue pain meds  -  PT/OT while inpatient.  PT recommends home PT.  Gastrointestinal:   A:  Constipation, Mouth ulcers, abnormal soft tissue swelling around abdominal aorta on CT and ? infiltrative process throughout spleen.  Plan:  -Continue bowel regimen PRN at home (discussed with  patient and wife) -Monitor oral exam  -? if he needs bx of abdominal mass > appears to be smaller on f/u CT scan after pred, can likely defer for now but he will need f/u imaging in a few months   A: protein calorie malnutrition  -seen by nutrition -discussed Ensure supplements   ID:  Antibiotics:  Doxycycline 5/10>>5/11  Zosyn 5/10>>5/18  Vancomycin 5/10>>5/18  Micafungin 5/17>>>5/18   Cultures:  Blood 5/10>>neg  Urine 5/10>>neg  Lt knee 5/11>>neg  Sputum 5/14>>> neg   Lab  12/05/11 0400  12/04/11 0440  12/03/11 0406   WBC  14.7*  14.6*  16.0*    A:  Leukocytosis- likely due to high dose steroids. Trending down, has had low grade fevers. UA 5/22 small LE, nitrate negative  P:  -f/u VATS biopsy/cultures     Endocrine:  A:  Steroid induced hyperglycemia   P:  - will continue on metformin 500mg  daily while on high dose steroids.  This can be tapered and eventually discontinued as steroids are tapered.    A:  Mild elevation in TSH  P:  - Will need further assessment as outpt        Consults: Dr. Donata Clay of [facility]  Discharge Exam: General - elderly male in no acute distress. walking  HEENT - PERRLA, Thrush cleared. HOARSE VOICE +  Chest - resps even non labored, diminished bases, bilateral posterior crackles, not coughing  Cardiac - s1s2 regular, no murmur, 2 cm JVD at 45 degrees  Abd - soft, nontender faint bowel sounds  Ext - no edema  Neuro - GCS 15, no focal deficits  Psych - normal mood, behavior. More optimistic  Skin - no rashes. Color good    Discharge Labs  BMET  Lab 12/10/11 0640 12/09/11 0440 12/05/11 0400 12/04/11 0440  NA 136 136 139 139  K 3.3* 3.7 -- --  CL 101 103 105 103  CO2 24 23 25 26   GLUCOSE 128* 110* 110* 102*  BUN 12 12 22 22   CREATININE 0.91 0.80 0.83 0.86  CALCIUM 8.5 8.1* 8.1* 8.5  MG 1.7 -- 2.0 --  PHOS 1.9* -- 1.9* --   CBC   Lab 12/09/11 0440 12/05/11 0400 12/04/11 0440  HGB 9.8* 10.2*  11.4*  HCT 30.2* 31.1* 34.3*  WBC 12.3* 14.7* 14.6*  PLT 454* 506* 615*     Discharge Orders    Future Appointments: Provider: Department: Dept Phone: Center:   12/15/2011 4:15 PM Julio Sicks, NP [facility] [phone] None   01/12/2012 4:30 PM Coralyn Helling, MD [facility] [phone] None     Future Orders Please Complete By Expires   Diet - low sodium heart healthy      Increase activity slowly      Call MD for:  temperature >100.4      Call MD for:  difficulty breathing, headache or visual disturbances      Call MD for:  persistant dizziness or light-headedness      Call MD for:  extreme fatigue      Call MD for:  redness, tenderness, or signs of infection (pain, swelling, redness, odor or green/yellow discharge around incision site)      Discharge instructions  Comments:   Check your blood sugar three times daily with meals and record.  Bring with you to your follow up appointment for review.   Increase activity slowly      Discharge instructions      Comments:   Sutures will be removed in pulmonary office at time of follow up.   Discharge wound care:      Comments:   Suture site:  Ok to wash with soap and water.  Keep clean and dry.       Follow-up Information    Follow up with PARRETT,TAMMY, NP on 12/15/2011. (Appt at 4:15.  Arrive at 3:30 for labs and chest xray.  Will need sutures removed at that time. )    Contact information:   [facility] [address] [place] [address] [phone]       Follow up with Coralyn Helling, MD on 01/12/2012. (Appt at 4:30 pm)    Contact information:   [address] [facility] [place] [address] [phone]       Schedule an appointment as soon as possible for a visit with Daisy Floro, MD. (Call for appt within one week for review of blood sugars)    Contact information:   [address] [place] [address] [phone]            Medication List  As of 12/10/2011 10:39 AM   START taking these medications         cyclophosphamide 50 MG tablet   Commonly known as: CYTOXAN   Take 3 tablets (150 mg total) by mouth daily. Give on an empty stomach 1 hour before or 2 hours after meals.      dextromethorphan-guaiFENesin 30-600 MG per 12 hr tablet   Commonly known as: MUCINEX DM   Take 1 tablet by mouth 2 (two) times daily.      magic mouthwash Soln   Take 10 mLs by mouth every 4 (four) hours as needed.      metFORMIN 500 MG tablet   Commonly known as: GLUCOPHAGE   Take 1 tablet (500 mg total) by mouth daily with breakfast.      ondansetron 4 MG tablet   Commonly known as: ZOFRAN   Take 1 tablet (4 mg total) by mouth every 8 (eight) hours as needed.      phosphorus 155-852-130 MG tablet   Commonly known as: K PHOS NEUTRAL   Take 2 tablets by mouth daily.      polyethylene glycol packet   Commonly known as: MIRALAX / GLYCOLAX   Take 17 g by mouth daily.      predniSONE 20 MG tablet   Commonly known as: DELTASONE   Take 3 tablets (60 mg total) by mouth daily with breakfast.      senna 8.6 MG Tabs   Commonly known as: SENOKOT   Take 1 tablet (8.6 mg total) by mouth 2 (two) times daily.         CONTINUE taking these medications         acetaminophen 500 MG tablet   Commonly known as: TYLENOL      B-complex with vitamin C tablet      Fish Oil 1200 MG Caps      HYDROcodone-acetaminophen 5-500 MG per tablet   Commonly known as: VICODIN      ibuprofen 200 MG tablet   Commonly known as: ADVIL,MOTRIN      multivitamin with minerals tablet  STOP taking these medications         levofloxacin 500 MG tablet          Where to get your medications    These are the prescriptions that you need to pick up.   You may get these medications from any pharmacy.         cyclophosphamide 50 MG tablet   magic mouthwash Soln   metFORMIN 500 MG tablet   ondansetron 4 MG tablet   phosphorus  [id number] MG tablet   predniSONE 20 MG tablet   senna 8.6 MG Tabs         Information on where to get these meds is not yet available. Ask your nurse or doctor.         dextromethorphan-guaiFENesin 30-600 MG per 12 hr tablet   polyethylene glycol packet             Disposition: 01-Home or Self Care  Discharged Condition: Kule Gascoigne has met maximum benefit of inpatient care and is medically stable and cleared for discharge.  He is off Oxygen and is ambulatory without assistance.  Patient is pending follow up as above.      Time spent on disposition:  Greater than 35 minutes.   Signed: Canary Brim, NP-C [facility] Pgr: [phone]    Staff note Personally supervised discharge  Dr. Kalman Shan, M.D., [facility].C.P Pulmonary and Critical Care Medicine Staff Physician [facility] Pager: [phone], If no answer or between  15:00h - 7:00h: call [phone]  12/10/2011 11:44 PM

## 2011-12-09 NOTE — Discharge Instructions (Signed)
Home Health to be provided by [facility] office phone : [phone]

## 2011-12-09 NOTE — Telephone Encounter (Signed)
Pt is scheduled to see TP on 12/15/11 for HFU. Shanda Bumps made aware and we will add this to appt reason to alert her the day of the appt.

## 2011-12-10 LAB — BASIC METABOLIC PANEL
BUN: 12 mg/dL (ref 6–23)
Chloride: 101 mEq/L (ref 96–112)
GFR calc Af Amer: >90 mL/min (ref >90)
Potassium: 3.3 mEq/L — ABNORMAL LOW (ref 3.5–5.1)

## 2011-12-10 LAB — GLUCOSE, CAPILLARY: Glucose-Capillary: 120 mg/dL — ABNORMAL HIGH (ref 70–99)

## 2011-12-10 MED ORDER — K PHOS MONO-SOD PHOS DI & MONO 155-852-130 MG PO TABS: 2.0000 | ORAL_TABLET | Freq: Every day | ORAL | Status: DC

## 2011-12-10 MED ORDER — POTASSIUM CHLORIDE CRYS ER 20 MEQ PO TBCR
40.0000 meq | EXTENDED_RELEASE_TABLET | Freq: Once | ORAL | Status: AC
  Administered 2011-12-10: 40 meq via ORAL
  Filled 2011-12-10 (×2): qty 2

## 2011-12-10 MED ORDER — K PHOS MONO-SOD PHOS DI & MONO 155-852-130 MG PO TABS
500.0000 mg | ORAL_TABLET | Freq: Every day | ORAL | Status: DC
Start: 2011-12-10 — End: 2011-12-10
  Administered 2011-12-10: 500 mg via ORAL
  Filled 2011-12-10: qty 2

## 2011-12-10 MED ORDER — ONDANSETRON HCL 4 MG PO TABS: 4.0000 mg | ORAL_TABLET | Freq: Three times a day (TID) | ORAL | Status: AC | PRN

## 2011-12-10 MED ORDER — METFORMIN HCL 500 MG PO TABS: 500.0000 mg | ORAL_TABLET | Freq: Every day | ORAL | Status: DC

## 2011-12-10 NOTE — Progress Notes (Signed)
Physical Therapy Note  Spoke with patient this morning. Plan to return at 10 am to educate pt and wife on home exercise program.  Ivonne Andrew PT, DPT Pager: [phone]

## 2011-12-10 NOTE — Progress Notes (Signed)
Discharge home. Home discharge instruction given,  

## 2011-12-10 NOTE — Progress Notes (Signed)
eLink Physician-Brief Progress Note Patient Name: Robert Mcgee DOB: 1940/12/16 MRN: [id number]  Date of Service  12/10/2011   HPI/Events of Note   Lab 12/10/11 0640 12/09/11 0440 12/05/11 0400 12/04/11 0440  K 3.3* 3.7 3.4* 3.6   Glucose 128 this am   eICU Interventions  Give 40kcl po now. Have asked RN to call lab and run mag and phos; if low will replete either as inpatient or outpatient and send him home on low dose supplemental K  Glucose 120s this am: sending home on metformin 500mg  daily (will not escalate to 1000mg  daily). AT followup MD to decide on continuation or dc        [name] 12/10/2011, 8:47 AM

## 2011-12-10 NOTE — Progress Notes (Signed)
Occupational Therapy Treatment Patient Details Name: Robert Mcgee MRN: [id number] DOB: 08-Mar-1941 Today's Date: 12/10/2011 Time: 7829-5621 OT Time Calculation (min): 9 min  OT Assessment / Plan / Recommendation Comments on Treatment Session Pt. with increased progress today and very motivated for return home later today.    Follow Up Recommendations  Home health OT;Supervision/Assistance - 24 hour       Equipment Recommendations  None recommended by OT       Frequency Min 2X/week   Plan Discharge plan remains appropriate    Precautions / Restrictions Precautions Precautions: Fall Restrictions Weight Bearing Restrictions: No       ADL  Lower Body Dressing: Performed;Set up;Supervision/safety Where Assessed - Lower Body Dressing: Unsupported sit to stand Equipment Used: Gait belt;Rolling walker;Other (comment) Transfers/Ambulation Related to ADLs: Supervision in room ADL Comments: Pt. with left shoulder pain with external rotation and shoulder flexion. Educated pt. on exercises and positioning of left UE to decreased pain and maintain joint integrity to promote functional use of UE. Pt. complete left UE flexion exercises with sliding hand up wall to achieve full flexion and holding to promote a stretch. Pt. reports completing shower last night with wife assist and getting around the room without assist. Pt. able to recall energy conservation strategies and will benefit from further practice using techniques in home environment to ensure carry over.      OT Goals Acute Rehab OT Goals OT Goal Formulation: With patient Time For Goal Achievement: 12/11/11 Potential to Achieve Goals: Good ADL Goals Pt Will Perform Grooming: with modified independence;Standing at sink ADL Goal: Grooming - Progress: Met Pt Will Perform Upper Body Bathing: with set-up;Sitting at sink;Sitting, chair;Sitting, edge of bed ADL Goal: Upper Body Bathing - Progress: Met Pt Will Perform Lower Body Bathing:  with set-up;with supervision;Sit to stand from chair;Sit to stand in shower;Sit to stand from bed ADL Goal: Lower Body Bathing - Progress: Met Pt Will Perform Lower Body Dressing: with supervision;with set-up;Sit to stand from chair;Sit to stand from bed ADL Goal: Lower Body Dressing - Progress: Met Additional ADL Goal #1: Pt will verbalize/demonstrate 3/3 energy conservation techniques during all ADL tasks. ADL Goal: Additional Goal #1 - Progress: Met  Visit Information  Last OT Received On: 12/10/11 Assistance Needed: +1          Cognition  Overall Cognitive Status: Appears within functional limits for tasks assessed/performed Arousal/Alertness: Awake/alert Orientation Level: Appears intact for tasks assessed Behavior During Session: [facility] for tasks performed    Mobility Bed Mobility Bed Mobility: Supine to Sit;Sit to Supine Supine to Sit: 6: Modified independent (Device/Increase time) Sit to Supine: 6: Modified independent (Device/Increase time) Transfers Transfers: Sit to Stand Sit to Stand: 6: Modified independent (Device/Increase time) Stand to Sit: 6: Modified independent (Device/Increase time)   Exercises Shoulder Exercises Shoulder Flexion: AROM;AAROM;Left;10 reps;Sidelying Shoulder External Rotation: AROM;AAROM;10 reps;Left;Sidelying;Standing Neck Lateral Flexion - Right: AROM;5 reps;Seated Neck Lateral Flexion - Left: AROM;Seated;5 reps  Balance Balance Balance Assessed: No  End of Session OT - End of Session Activity Tolerance: Patient tolerated treatment well Patient left: in chair;with call bell/phone within reach;with family/visitor present Nurse Communication: Mobility status   Naseer Hearn, OTR/L Pager [phone] 12/10/2011, 10:58 AM

## 2011-12-10 NOTE — Progress Notes (Signed)
Physical Therapy Treatment Patient Details Name: Robert Mcgee MRN: [id number] DOB: 15-Nov-1940 Today's Date: 12/10/2011 Time: 4098-1191 PT Time Calculation (min): 10 min  PT Assessment / Plan / Recommendation Comments on Treatment Session  3 attempts today to go over HEP. Gave pt HEP earlier in the day to review and plan was to come back and go over exercises however pt with nurse practioner and nurse for d/c planning on my other attempts. Spoke with Mr Kubitz about the exercises program. Reviewed exercises and made it clear that I do feel HHPT would be beneficial for him. He reports that they are coming on Thursday. Pt knows to wait and begin exercise program with HHPT so as to make sure he is doing them properly. Pt still complaining of pain in his left shoulder with external rotation. Pt able to demonstrate gravity eliminated ER well and shoulder flexion AAROM. Educated pt on technique for dressing with painful shoulder. Pt understands to f/u with HHPT about shoulder pain and possible referral for OPPT vs orthopedist dependending pt's progress with this.  Pt denies further concerns for d/c home from physical therapy stand point. Pt will have assist from wife upon going home.     Follow Up Recommendations  Home health PT;Supervision - Intermittent    Barriers to Discharge        Equipment Recommendations  None recommended by PT    Recommendations for Other Services    Frequency     Plan Discharge plan remains appropriate    Precautions / Restrictions Precautions Precautions: Fall Restrictions Weight Bearing Restrictions: No       Mobility  Bed Mobility Bed Mobility: Supine to Sit;Sit to Supine Supine to Sit: 6: Modified independent (Device/Increase time) Sit to Supine: 6: Modified independent (Device/Increase time) Details for Bed Mobility Assistance: pt sitting upright EOB Transfers Sit to Stand: 6: Modified independent (Device/Increase time);With upper extremity assist;From  bed Stand to Sit: 6: Modified independent (Device/Increase time);With upper extremity assist;Other (comment) (to w/c) Ambulation/Gait Ambulation/Gait Assistance: 5: Supervision Ambulation Distance (Feet): 7 Feet Assistive device: None Ambulation/Gait Assistance Details: crouched type gait    Exercises AAROM shoulder flexion x2 (seated) Seated right ER with elbow abducted against trunk (gravity eliminated)      PT Goals Acute Rehab PT Goals PT Goal: Sit to Stand - Progress: Partly met PT Goal: Stand to Sit - Progress: Partly met  Visit Information  Last PT Received On: 12/10/11 Assistance Needed: +1    Subjective Data  Subjective: I think when I get home I am going to take it easy and then I will work on getting stronger.    Cognition  Overall Cognitive Status: Appears within functional limits for tasks assessed/performed Arousal/Alertness: Awake/alert Orientation Level: Appears intact for tasks assessed Behavior During Session: [facility] for tasks performed    Balance  Balance Balance Assessed: No  End of Session PT - End of Session Activity Tolerance: Other (comment) (pt about to discharge) Patient left: Other (comment) (w/c with volunteer to bring him out for discharge)    [facility] HELEN 12/10/2011, 12:26 PM

## 2011-12-12 ENCOUNTER — Telehealth: Payer: Self-pay | Admitting: Pulmonary Disease

## 2011-12-12 NOTE — Telephone Encounter (Signed)
I spoke with spouse and advised her of SN recs. She stated she just got off the phone with his pcp and is awaiting for the doctor to call back. Nothing further was needed

## 2011-12-12 NOTE — Telephone Encounter (Signed)
Steroid induced hyperglycemia needs to be managed by his PCP.

## 2011-12-12 NOTE — Telephone Encounter (Signed)
I spoke with Aurea Graff and she states pt was put on Metformin 500 mg QD while being on high dose of prednisone from his hospital visit. A: Steroid induced hyperglycemia P: - will continue on metformin 500mg  daily while on high dose steroids. This can be tapered and eventually discontinued as steroids are tapered. Per spouse since yesterday pt BS has been ranging from 190's-211. She was per spouse by Merry Proud NP told to call our office if his BS is over 125. I advised her to call pt PCP as well but will also get the message over to VS to see if he has any recs. Pt is scheduled to see TP on 12/15/11 for HFU. Please advise if you have any recs for pt VS, thanks  No Known Allergies

## 2011-12-12 NOTE — Telephone Encounter (Signed)
lmomtcb x1 for joan to make aware

## 2011-12-15 ENCOUNTER — Encounter: Payer: Self-pay | Admitting: Adult Health

## 2011-12-15 ENCOUNTER — Ambulatory Visit (INDEPENDENT_AMBULATORY_CARE_PROVIDER_SITE_OTHER)
Admission: RE | Admit: 2011-12-15 | Discharge: 2011-12-15 | Disposition: A | Discharge status: Home / Self Care | Payer: Medicare Other | Source: Ambulatory Visit | Attending: Adult Health | Admitting: Adult Health

## 2011-12-15 ENCOUNTER — Ambulatory Visit (INDEPENDENT_AMBULATORY_CARE_PROVIDER_SITE_OTHER): Payer: Medicare Other | Admitting: Adult Health

## 2011-12-15 ENCOUNTER — Other Ambulatory Visit (INDEPENDENT_AMBULATORY_CARE_PROVIDER_SITE_OTHER): Payer: Medicare Other

## 2011-12-15 VITALS — BP 124/68 | HR 82 | Temp 97.2°F | Ht 74.0 in | Wt 194.2 lb

## 2011-12-15 DIAGNOSIS — R918 Other nonspecific abnormal finding of lung field: Secondary | ICD-10-CM

## 2011-12-15 LAB — MAGNESIUM: Magnesium: 1.9 mg/dL (ref 1.5–2.5)

## 2011-12-15 LAB — URINALYSIS, ROUTINE W REFLEX MICROSCOPIC
Bilirubin Urine: NEGATIVE
Ketones, ur: NEGATIVE
Leukocytes, UA: NEGATIVE
Urobilinogen, UA: 0.2 (ref 0.0–1.0)

## 2011-12-15 LAB — BASIC METABOLIC PANEL
Calcium: 8.7 mg/dL (ref 8.4–10.5)
Creatinine, Ser: 1.1 mg/dL (ref 0.4–1.5)

## 2011-12-15 LAB — PHOSPHORUS: Phosphorus: 3 mg/dL (ref 2.3–4.6)

## 2011-12-15 NOTE — Progress Notes (Signed)
Subjective:    Patient ID: Robert Mcgee, male    DOB: 1940-11-07, 71 y.o.   MRN: [id number]  HPI 71 y.o. y/o male with a PMH of kidney stones admitted on 11/21/2011 with fever, polyarthralgia, and found to have multiple pulmonary nodules. Underwent extensive work up with VATS  working dx of   [employer]. Tx with cytoxan and high dose steroids . Seen for initial pulmonary consult during this admission .  12/15/11 Post Hospital  Pt returns after prolonged hospitalization 5/10-5/29/13 for acute respiratory failure with bilateral pulmonary infiltrates . He underwent an extensive workup with VATS /open lung bx on 5/17.  Started on Cytoxan for presumed Wegners along with high dose steroids.  He was discharged on O2.  Since discharge is feeling better w/ decreased DOE.  CXR today shows decreased pulmonary infiltrates.  Bx path report is pending - will call pathology for results.  No hemotpysis or chest pain . In increased dyspnea.    Work up summary .  11/17/11 CT chest>>borderline hilar/mediastinal adenopathy, b/l rounded nodules of varying size up to 2.6 cm   11/24/11 PET scan>>numerous b/l pulmonary nodules with SUV up to 12.1, SUV 6.2 in abnormal soft tissue around abdominal aorta, SUV 6.1 Lt prostate  11/22/11 Echo>>mild LVH, EF 65 to 70%, grade 1 diastolic dysfx, mild AS, mild LA/RA dilation, PAS 42 mmHg  5/17: VATS  ANCA>>>POS 1: 640  ANA>>>neg  CEA>>>neg  RF>>>18  RMSF>>>neg   -completed pulse steroid 05/18 to 05/20;  Then  prednisone 60 mg daily  -cytoxan 150 mg (1.5mg /kg) qd started on 5/23;     Review of Systems Constitutional:   No  weight loss, night sweats,  Fevers, chills,  +fatigue, or  lassitude.  HEENT:   No headaches,  Difficulty swallowing,  Tooth/dental problems, or  Sore throat,                No sneezing, itching, ear ache, nasal congestion, post nasal drip,   CV:  No chest pain,  Orthopnea, PND, swelling in lower extremities, anasarca, dizziness, palpitations,  syncope.   GI  No heartburn, indigestion, abdominal pain, nausea, vomiting, diarrhea, change in bowel habits, loss of appetite, bloody stools.   Resp: ,  No coughing up of blood.  No change in color of mucus.  No wheezing.  No chest wall deformity  Skin: no rash or lesions.  GU: no dysuria, change in color of urine, no urgency or frequency.  No flank pain, no hematuria   MS:  No joint pain or swelling.  No decreased range of motion.     Psych:  No change in mood or affect. No depression or anxiety.  No memory loss.        Objective:   Physical Exam GEN: A/Ox3; pleasant , NAD, well nourished   HEENT:  [place]/AT,  EACs-clear, TMs-wnl, NOSE-clear, THROAT-clear, no lesions, no postnasal drip or exudate noted.   NECK:  Supple w/ fair ROM; no JVD; normal carotid impulses w/o bruits; no thyromegaly or nodules palpated; no lymphadenopathy.  RESP  Coarse BS w/o, wheezes/ rales/ or rhonchi.no accessory muscle use, no dullness to percussion  CARD:  RRR, no m/r/g  , no peripheral edema, pulses intact, no cyanosis or clubbing.  GI:   Soft & nt; nml bowel sounds; no organomegaly or masses detected.  Musco: Warm bil, no deformities or joint swelling noted.   Neuro: alert, no focal deficits noted.    Skin: Warm, no lesions or rashes  Assessment & Plan:

## 2011-12-15 NOTE — Patient Instructions (Signed)
Continue on Prednisone 60mg  daily until 6/14 then decrease 40mg  daily and hold at this dose until seen back in office .  Continue on Cytoxan daily  I will call with labs  On 12/26/11 , return for labs w/ cbc and bmet .  follow up Dr. Craige Cotta  In 4 weeks as planned and As needed

## 2011-12-16 LAB — CBC WITH DIFFERENTIAL/PLATELET
Basophils Absolute: 0 10*3/uL (ref 0.0–0.1)
Eosinophils Absolute: 0 10*3/uL (ref 0.0–0.7)
HCT: 37.6 % — ABNORMAL LOW (ref 39.0–52.0)
Hemoglobin: 12.3 g/dL — ABNORMAL LOW (ref 13.0–17.0)
Lymphocytes Relative: 8.4 % — ABNORMAL LOW (ref 12.0–46.0)
Lymphs Abs: 0.5 10*3/uL — ABNORMAL LOW (ref 0.7–4.0)
MCHC: 32.7 g/dL (ref 30.0–36.0)
Neutro Abs: 5.3 10*3/uL (ref 1.4–7.7)
Platelets: 247 10*3/uL (ref 150.0–400.0)
RDW: 19.8 % — ABNORMAL HIGH (ref 11.5–14.6)

## 2011-12-16 NOTE — Assessment & Plan Note (Signed)
Presumed Wegeners  Will find path /bx results  For now check labs -on cytoxan and high dose steroids   Plan:  Continue on Prednisone 60mg  daily until 6/14 then decrease 40mg  daily and hold at this dose until seen back in office .  Continue on Cytoxan daily  I will call with labs  On 12/26/11 , return for labs w/ cbc and bmet .  follow up Dr. Craige Cotta  In 4 weeks as planned and As needed

## 2011-12-17 NOTE — Progress Notes (Signed)
Reviewed and agree with assessment/plan. 

## 2011-12-18 ENCOUNTER — Telehealth: Payer: Self-pay | Admitting: Pulmonary Disease

## 2011-12-18 NOTE — Telephone Encounter (Addendum)
Spoke with pt's son Morrie Sheldon. He states that he while pt was in the hospital under PCCM service had bronchoscopy and was told would receive results in 72 hrs. The pt has still yet to get these results. He states that the pt is going to be seen for second opinion in [place]. He requested that we send all records to [facility], and this was done, but the path report was still not there. I advised will be happy to look into this for him and see if I can find out why.  He states that he is dissatisfied with the service that we have provided for the pt. He states that there have been more questions than answers and he wishes to speak with manager about this. I advised will send msg to TD to address today.   I called and spoke with Malachi Bonds with [facility] and she states that path report not sent here b/c Dr. Craige Cotta was not cc'ed on the result b/c Dr Morton Peters was the provider who ordered the test. I had her fax [place] a copy.

## 2011-12-18 NOTE — Telephone Encounter (Signed)
error 

## 2011-12-19 NOTE — Telephone Encounter (Signed)
OV on 12/15/11 with review of all info with son/wife/pt  Phone conversation with wife on 12/16/11 with lab/path  results  Phone conversation with wife on 12/17/11 with plan to cont cytoxan until Dr. Craige Cotta  Can discuss further the bx results with pathologist .   Phone conversation with wife 12/19/11 , path report copy at front desk for family to p/u  Pending ov for [facility] for second opinion.

## 2011-12-23 ENCOUNTER — Telehealth: Payer: Self-pay | Admitting: Adult Health

## 2011-12-23 NOTE — Telephone Encounter (Signed)
Sent [place] mobility msg to Dr. Craige Cotta advising msg sent to his box; pls call pt and wife ASAP.  Will forward msg to his box to address.  Thank you.

## 2011-12-23 NOTE — Telephone Encounter (Signed)
Spoke with Aurea Graff; she is aware that I will send message to TP. Appreciated the call to inform her of this matter.

## 2011-12-23 NOTE — Telephone Encounter (Signed)
Reviewed bx results with pathology.  Discussed results with pt's wife.  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  Explained that dx is not excluded based on biopsy results.  I have recommend that he continue cytoxan and prednisone as planned.  He will have f/u lab work later this week.    They are going for second opinion at [facility], and I have encouraged this.    They will call if they need any additional information prior to evaluation at [facility].  Will plan to have follow up with me on July 1.

## 2011-12-23 NOTE — Telephone Encounter (Signed)
Please send to Dr. Craige Cotta  He says he will call her

## 2011-12-26 ENCOUNTER — Other Ambulatory Visit (INDEPENDENT_AMBULATORY_CARE_PROVIDER_SITE_OTHER): Payer: Medicare Other

## 2011-12-26 ENCOUNTER — Telehealth: Payer: Self-pay | Admitting: Pulmonary Disease

## 2011-12-26 ENCOUNTER — Other Ambulatory Visit: Payer: Self-pay | Admitting: Pulmonary Disease

## 2011-12-26 DIAGNOSIS — R918 Other nonspecific abnormal finding of lung field: Secondary | ICD-10-CM

## 2011-12-26 DIAGNOSIS — R5383 Other fatigue: Secondary | ICD-10-CM

## 2011-12-26 DIAGNOSIS — I776 Arteritis, unspecified: Secondary | ICD-10-CM

## 2011-12-26 DIAGNOSIS — R531 Weakness: Secondary | ICD-10-CM

## 2011-12-26 LAB — CBC WITH DIFFERENTIAL/PLATELET
Basophils Relative: 0.1 % (ref 0.0–3.0)
Eosinophils Absolute: 0 10*3/uL (ref 0.0–0.7)
Hemoglobin: 12.7 g/dL — ABNORMAL LOW (ref 13.0–17.0)
Lymphocytes Relative: 13.2 % (ref 12.0–46.0)
MCHC: 32.3 g/dL (ref 30.0–36.0)
MCV: 95.4 fl (ref 78.0–100.0)
Neutro Abs: 7.6 10*3/uL (ref 1.4–7.7)
RBC: 4.12 Mil/uL — ABNORMAL LOW (ref 4.22–5.81)

## 2011-12-26 LAB — BASIC METABOLIC PANEL
BUN: 17 mg/dL (ref 6–23)
Chloride: 102 mEq/L (ref 96–112)
Creatinine, Ser: 1.1 mg/dL (ref 0.4–1.5)

## 2011-12-26 LAB — FUNGUS CULTURE W SMEAR: Fungal Smear: NONE SEEN

## 2011-12-26 MED ORDER — PREDNISONE 20 MG PO TABS: 40.0000 mg | ORAL_TABLET | Freq: Every day | ORAL | Status: AC

## 2011-12-26 MED ORDER — SULFAMETHOXAZOLE-TRIMETHOPRIM 800-160 MG PO TABS: 1.0000 | ORAL_TABLET | ORAL | Status: AC

## 2011-12-26 NOTE — Telephone Encounter (Signed)
I spoke with spouse and is aware of the results. She aware rx's have been sent to the pharmacy and the directions. She had no questions

## 2011-12-26 NOTE — Telephone Encounter (Signed)
CMP     Component Value Date/Time   NA 139 12/26/2011 1107   K 4.0 12/26/2011 1107   CL 102 12/26/2011 1107   CO2 28 12/26/2011 1107   GLUCOSE 58* 12/26/2011 1107   BUN 17 12/26/2011 1107   CREATININE 1.1 12/26/2011 1107   CALCIUM 8.9 12/26/2011 1107   PROT 5.8* 11/30/2011 0500   ALBUMIN 1.8* 11/30/2011 0500   AST 15 11/30/2011 0500   ALT 24 11/30/2011 0500   ALKPHOS 132* 11/30/2011 0500   BILITOT 0.4 11/30/2011 0500   GFRNONAA 83* 12/10/2011 0640   GFRAA >90 12/10/2011 0640    CBC    Component Value Date/Time   WBC 9.2 12/26/2011 1107   RBC 4.12* 12/26/2011 1107   HGB 12.7* 12/26/2011 1107   HCT 39.3 12/26/2011 1107   PLT 267.0 12/26/2011 1107   MCV 95.4 12/26/2011 1107   MCH 29.3 12/09/2011 0440   MCHC 32.3 12/26/2011 1107   RDW 21.6* 12/26/2011 1107   LYMPHSABS 1.2 12/26/2011 1107   MONOABS 0.4 12/26/2011 1107   EOSABS 0.0 12/26/2011 1107   BASOSABS 0.0 12/26/2011 1107    Attempted to call patient with results.   Will have my nurse inform patient that his lab tests were okay.  He can change prednisone to 40 mg daily starting from tomorrow, and should continue cytoxan 150 mg daily.  Please also ensure that he is taking bactrim DS one pill on Monday, Wednesday, and Friday.  I have sent scripts for prednisone and bactrim to his pharmacy.

## 2011-12-31 ENCOUNTER — Telehealth: Payer: Self-pay | Admitting: Pulmonary Disease

## 2011-12-31 NOTE — Telephone Encounter (Signed)
Spoke with wife-wanted to know why VS put patient on Biaxin-per [place] this is preventive due to being on cytoxan. Wife understood and will await answers from VS on Monday 01-05-12 about cytoxan not being covered and would also like to know if any results came in as well on upper lobe biopsy.

## 2012-01-04 LAB — AFB CULTURE WITH SMEAR (NOT AT ARMC)

## 2012-01-12 ENCOUNTER — Ambulatory Visit: Payer: Medicare Other | Admitting: Pulmonary Disease

## 2012-01-12 LAB — AFB CULTURE WITH SMEAR (NOT AT ARMC)
Acid Fast Smear: NONE SEEN
Acid Fast Smear: NONE SEEN

## 2012-01-13 NOTE — Telephone Encounter (Signed)
Dr. Craige Cotta has this been take care of? Please advise if we need to do anything with this message. Carron Curie, CMA

## 2012-01-13 NOTE — Telephone Encounter (Signed)
Left message on voicemail.  Advised her to call back if she needs further assistance.

## 2012-01-29 ENCOUNTER — Telehealth: Payer: Self-pay | Admitting: Pulmonary Disease

## 2012-01-29 NOTE — Telephone Encounter (Signed)
I dont have this

## 2012-01-29 NOTE — Telephone Encounter (Signed)
LMTCB

## 2012-01-29 NOTE — Telephone Encounter (Signed)
VS, please advise the status of the form from [facility]. Let me know if you don't have it, I will call and refax. Thanks

## 2012-01-30 NOTE — Telephone Encounter (Signed)
Robert Mcgee will refax this to triage.  Will hold in triage until fax is received.

## 2012-02-02 NOTE — Telephone Encounter (Signed)
Form received and placed in VS look at.

## 2012-02-02 NOTE — Telephone Encounter (Signed)
Form completed, and put in go back bin.

## 2012-02-26 ENCOUNTER — Other Ambulatory Visit: Payer: Self-pay | Admitting: Dermatology

## 2012-04-23 ENCOUNTER — Ambulatory Visit (AMBULATORY_SURGERY_CENTER): Payer: Medicare Other | Admitting: *Deleted

## 2012-04-23 VITALS — Ht 73.5 in | Wt 210.0 lb

## 2012-04-23 DIAGNOSIS — Z1211 Encounter for screening for malignant neoplasm of colon: Secondary | ICD-10-CM

## 2012-04-23 MED ORDER — SUPREP BOWEL PREP KIT 17.5-3.13-1.6 GM/177ML PO SOLN: ORAL | Status: DC

## 2012-05-14 ENCOUNTER — Encounter: Payer: Medicare Other | Admitting: Internal Medicine

## 2012-07-23 ENCOUNTER — Encounter: Payer: Self-pay | Admitting: Internal Medicine

## 2012-08-06 ENCOUNTER — Ambulatory Visit (AMBULATORY_SURGERY_CENTER): Payer: Medicare Other | Admitting: *Deleted

## 2012-08-06 VITALS — Ht 74.0 in | Wt 222.0 lb

## 2012-08-06 DIAGNOSIS — Z1211 Encounter for screening for malignant neoplasm of colon: Secondary | ICD-10-CM

## 2012-08-20 ENCOUNTER — Ambulatory Visit (AMBULATORY_SURGERY_CENTER): Payer: Medicare Other | Admitting: Internal Medicine

## 2012-08-20 ENCOUNTER — Encounter: Payer: Self-pay | Admitting: Internal Medicine

## 2012-08-20 VITALS — BP 155/67 | HR 60 | Temp 97.3°F | Resp 18 | Ht 74.0 in | Wt 222.0 lb

## 2012-08-20 DIAGNOSIS — D126 Benign neoplasm of colon, unspecified: Secondary | ICD-10-CM

## 2012-08-20 DIAGNOSIS — K648 Other hemorrhoids: Secondary | ICD-10-CM

## 2012-08-20 DIAGNOSIS — K635 Polyp of colon: Secondary | ICD-10-CM

## 2012-08-20 DIAGNOSIS — Z1211 Encounter for screening for malignant neoplasm of colon: Secondary | ICD-10-CM

## 2012-08-20 DIAGNOSIS — Z860100 Personal history of colon polyps, unspecified: Secondary | ICD-10-CM

## 2012-08-20 DIAGNOSIS — K5730 Diverticulosis of large intestine without perforation or abscess without bleeding: Secondary | ICD-10-CM

## 2012-08-20 HISTORY — DX: Personal history of colonic polyps: Z86.010

## 2012-08-20 MED ORDER — SODIUM CHLORIDE 0.9 % IV SOLN: 500.0000 mL | INTRAVENOUS | Status: DC

## 2012-08-20 NOTE — Op Note (Signed)
[facility] [address] [place], [address]   COLONOSCOPY PROCEDURE REPORT  PATIENT: Robert Mcgee, Robert Mcgee  MR#: [id number] BIRTHDATE: Aug 26, 1940 , 71  yrs. old GENDER: Male ENDOSCOPIST: Iva Boop, MD, [facility] PROCEDURE DATE:  08/20/2012 PROCEDURE:   Colonoscopy with biopsy and snare polypectomy ASA CLASS:   Class III INDICATIONS:Screening and surveillance,personal history of colonic polyps and Patient's personal history of adenomatous colon polyps.  MEDICATIONS: propofol (Diprivan) 300mg  IV, MAC sedation, administered by CRNA, and These medications were titrated to patient response per physician's verbal order  DESCRIPTION OF PROCEDURE:   After the risks benefits and alternatives of the procedure were thoroughly explained, informed consent was obtained.  A digital rectal exam revealed no abnormalities of the rectum, A digital rectal exam revealed no prostatic nodules, and A digital rectal exam revealed the prostate was not enlarged.   The LB [id number]  endoscope was introduced through the anus and advanced to the cecum, which was identified by both the appendix and ileocecal valve. No adverse events experienced.   The quality of the prep was Suprep excellent The instrument was then slowly withdrawn as the colon was fully examined.      COLON FINDINGS: Two diminutive smooth sessile polyps were found at the cecum.  A polypectomy was performed with cold forceps.  The resection was complete and the polyp tissue was completely retrieved.   Four polypoid shaped sessile polyps measuring 5-8 mm in size were found in the ascending colon, transverse colon, and descending colon.  A polypectomy was performed with a cold snare. The resection was complete and the polyp tissue was completely retrieved.   Severe diverticulosis was noted The finding was in the left colon.   Mild diverticulosis was noted The finding was in the right colon.   Small internal hemorrhoids  were found.   The colon mucosa was otherwise normal.  Retroflexed views revealed internal hemorrhoids. The time to cecum=3 minutes 02 seconds.  Withdrawal time=16 minutes 30 seconds.  The scope was withdrawn and the procedure completed. COMPLICATIONS: There were no complications.  ENDOSCOPIC IMPRESSION: 1.   Two diminutive sessile polyps were found at the cecum; polypectomy was performed with cold forceps 2.   Four sessile polyps measuring 5-8 mm in size were found in the ascending colon, transverse colon, and descending colon; polypectomy was performed with a cold snare 3.   Severe diverticulosis was noted in the left colon 4.   Mild diverticulosis was noted in the right colon 5.   Small internal hemorrhoids 6.   The colon mucosa was otherwise normal - excellent prep in patient w/ hx 5 mm adenoma removed 2008  RECOMMENDATIONS: Timing of repeat colonoscopy will be determined by pathology findings.   eSigned:  Iva Boop, MD, [facility] 08/20/2012 12:00 PM   cc: Gildardo Cranker MD and The Patient   PATIENT NAME:  Robert Mcgee, Robert Mcgee MR#: [id number]

## 2012-08-20 NOTE — Progress Notes (Signed)
Patient did not experience any of the following events: a burn prior to discharge; a fall within the facility; wrong site/side/patient/procedure/implant event; or a hospital transfer or hospital admission upon discharge from the facility. (G8907) Patient did not have preoperative order for IV antibiotic SSI prophylaxis. (G8918)  

## 2012-08-20 NOTE — Progress Notes (Signed)
Called to room to assist during endoscopic procedure.  Patient ID and intended procedure confirmed with present staff. Received instructions for my participation in the procedure from the performing physician.  

## 2012-08-20 NOTE — Progress Notes (Signed)
To PACU vss, bbs=clear report to RN, incremental doses given propofol

## 2012-08-20 NOTE — Patient Instructions (Addendum)
I removed 6 polyps - hey all look benign but this will probably mean a recommendation to return for a routine colonoscopy in about 3 years.  I will let you know pathology results and when to have another routine colonoscopy by mail.  You also have the common conditions of diverticulosis and hemorrhoids.  Thank you for choosing me and [facility].  Iva Boop, MD, FACG   YOU HAD AN ENDOSCOPIC PROCEDURE TODAY AT THE [facility]: Refer to the procedure report that was given to you for any specific questions about what was found during the examination.  If the procedure report does not answer your questions, please call your gastroenterologist to clarify.  If you requested that your care partner not be given the details of your procedure findings, then the procedure report has been included in a sealed envelope for you to review at your convenience later.  YOU SHOULD EXPECT: Some feelings of bloating in the abdomen. Passage of more gas than usual.  Walking can help get rid of the air that was put into your GI tract during the procedure and reduce the bloating. If you had a lower endoscopy (such as a colonoscopy or flexible sigmoidoscopy) you may notice spotting of blood in your stool or on the toilet paper. If you underwent a bowel prep for your procedure, then you may not have a normal bowel movement for a few days.  DIET: Your first meal following the procedure should be a light meal and then it is ok to progress to your normal diet.  A half-sandwich or bowl of soup is an example of a good first meal.  Heavy or fried foods are harder to digest and may make you feel nauseous or bloated.  Likewise meals heavy in dairy and vegetables can cause extra gas to form and this can also increase the bloating.  Drink plenty of fluids but you should avoid alcoholic beverages for 24 hours.  ACTIVITY: Your care partner should take you home directly after the procedure.  You should plan  to take it easy, moving slowly for the rest of the day.  You can resume normal activity the day after the procedure however you should NOT DRIVE or use heavy machinery for 24 hours (because of the sedation medicines used during the test).    SYMPTOMS TO REPORT IMMEDIATELY: A gastroenterologist can be reached at any hour.  During normal business hours, 8:30 AM to 5:00 PM Monday through Friday, call [phone].  After hours and on weekends, please call the GI answering service at [phone] who will take a message and have the physician on call contact you.   Following lower endoscopy (colonoscopy or flexible sigmoidoscopy):  Excessive amounts of blood in the stool  Significant tenderness or worsening of abdominal pains  Swelling of the abdomen that is new, acute  Fever above 101 degrees tonight  FOLLOW UP: If any biopsies were taken you will be contacted by phone or by letter within the next 1-3 weeks.  Call your gastroenterologist if you have not heard about the biopsies in 3 weeks.  Our staff will call the home number listed on your records the next business day following your procedure to check on you and address any questions or concerns that you may have at that time regarding the information given to you following your procedure. This is a courtesy call and so if there is no answer at the home number and we have not heard from  you through the emergency physician on call, we will assume that you have returned to your regular daily activities without incident.  SIGNATURES/CONFIDENTIALITY: You and/or your care partner have signed paperwork which will be entered into your electronic medical record.  These signatures attest to the fact that that the information above on your After Visit Summary has been reviewed and is understood.  Full responsibility of the confidentiality of this discharge information lies with you and/or your care-partner.

## 2012-08-23 ENCOUNTER — Telehealth: Payer: Self-pay | Admitting: *Deleted

## 2012-08-23 NOTE — Telephone Encounter (Signed)
  Follow up Call-  Call back number 08/20/2012  Post procedure Call Back phone  # [phone]  Permission to leave phone message Yes     Patient questions:  Do you have a fever, pain , or abdominal swelling? no Pain Score  0 *  Have you tolerated food without any problems? yes  Have you been able to return to your normal activities? yes  Do you have any questions about your discharge instructions: Diet   no Medications  no Follow up visit  no  Do you have questions or concerns about your Care? no  Actions: * If pain score is 4 or above: No action needed, pain <4.

## 2012-08-25 ENCOUNTER — Encounter: Payer: Self-pay | Admitting: Internal Medicine

## 2012-08-25 NOTE — Progress Notes (Signed)
Quick Note:  2 serrated adenomas 3 adenomas 1 hyperplastic Max 8 mm  Repeat colonoscopy about 08/2015 ______

## 2013-04-16 IMAGING — CR DG CHEST 1V PORT
1 series · 1 of 1 positions shown · non-contrast
Comparison: Multiple recent previous exams.

CLINICAL DATA: Respiratory failure

PORTABLE CHEST - 1 VIEW

[AP]
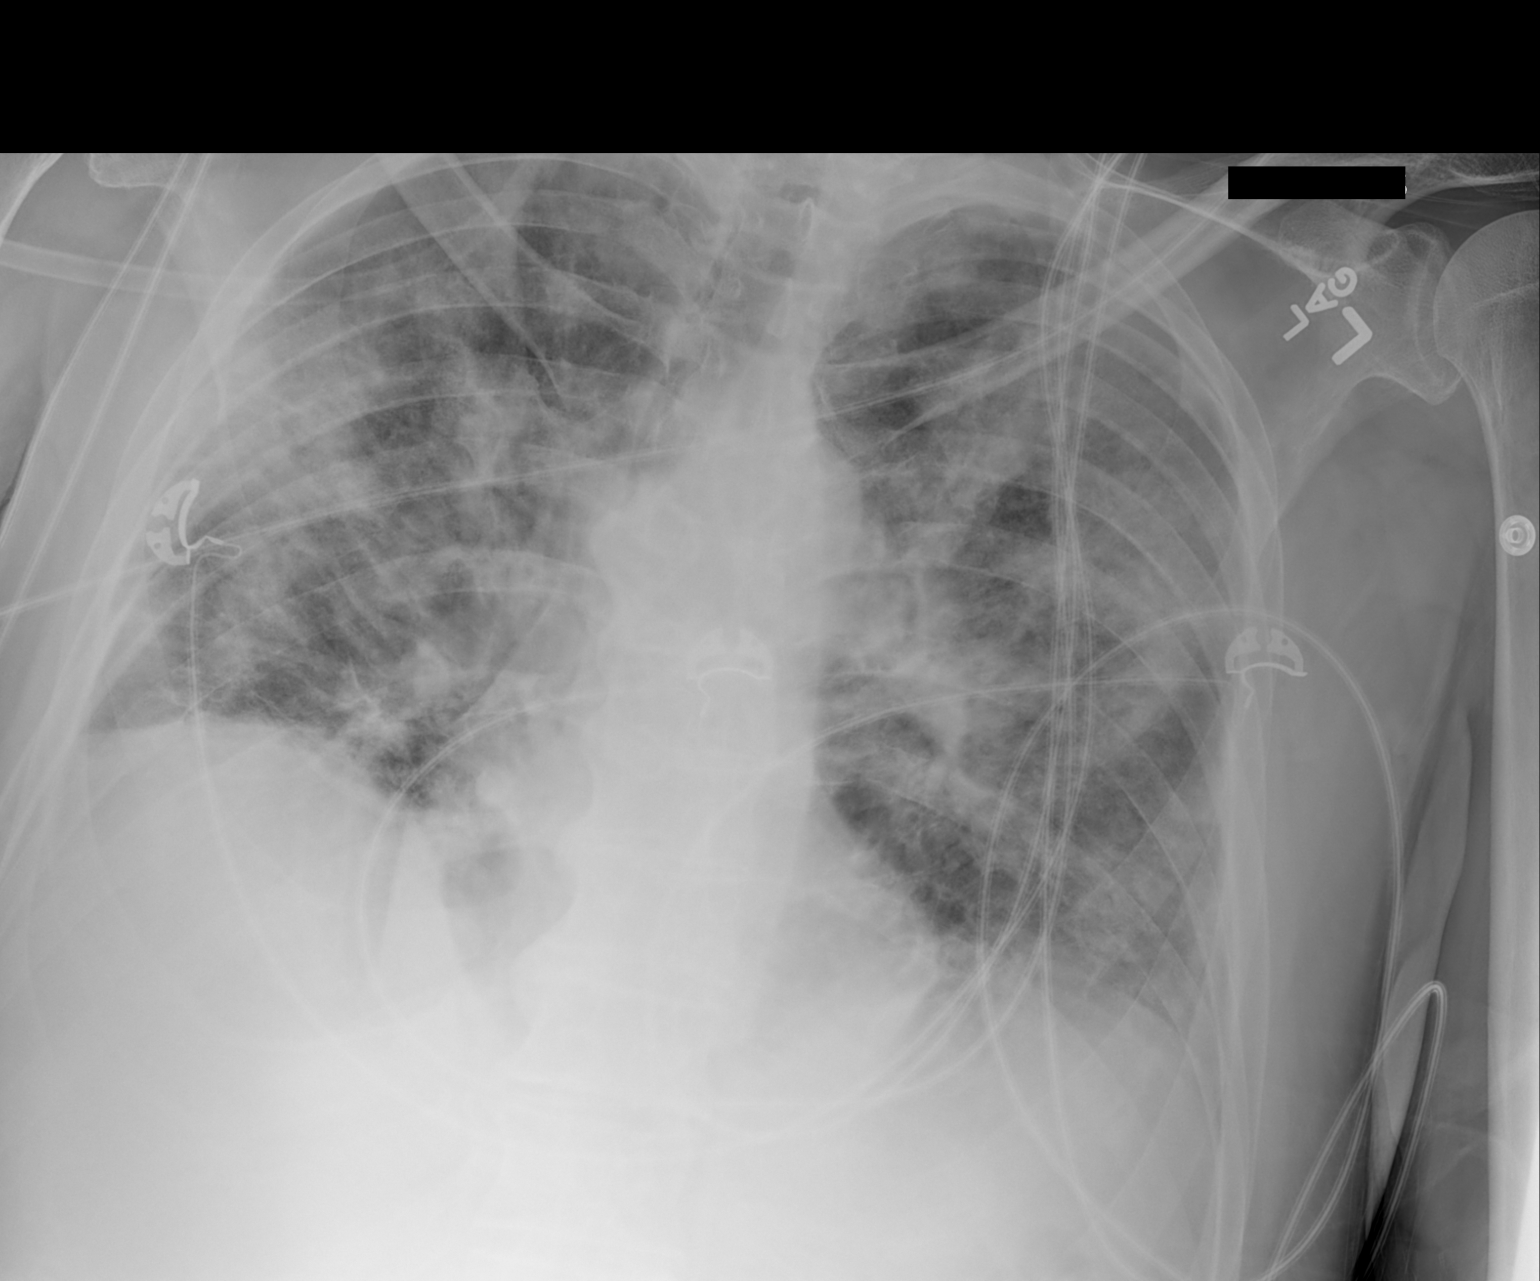

[1 of 1 positions shown; findings below may reference images not displayed]

FINDINGS: 5505 hours.  The patchy asymmetric airspace disease
persist bilaterally.  There is been no substantial interval change.
Underlying chronic interstitial opacity is evident.  The heart size
is stable. Telemetry leads overlie the chest.
IMPRESSION: No substantial interval change.  Bilateral patchy alveolar opacity
superimposed on chronic underlying interstitial changes.

## 2013-04-18 IMAGING — CR DG CHEST 1V PORT
1 series · 1 of 1 positions shown · non-contrast
Comparison: Chest radiograph 11/28/2011, 11/27/2011, and CT chest
11/17/2011.

CLINICAL DATA: Intubation.  Status post VATS.

PORTABLE CHEST - 1 VIEW

[AP]
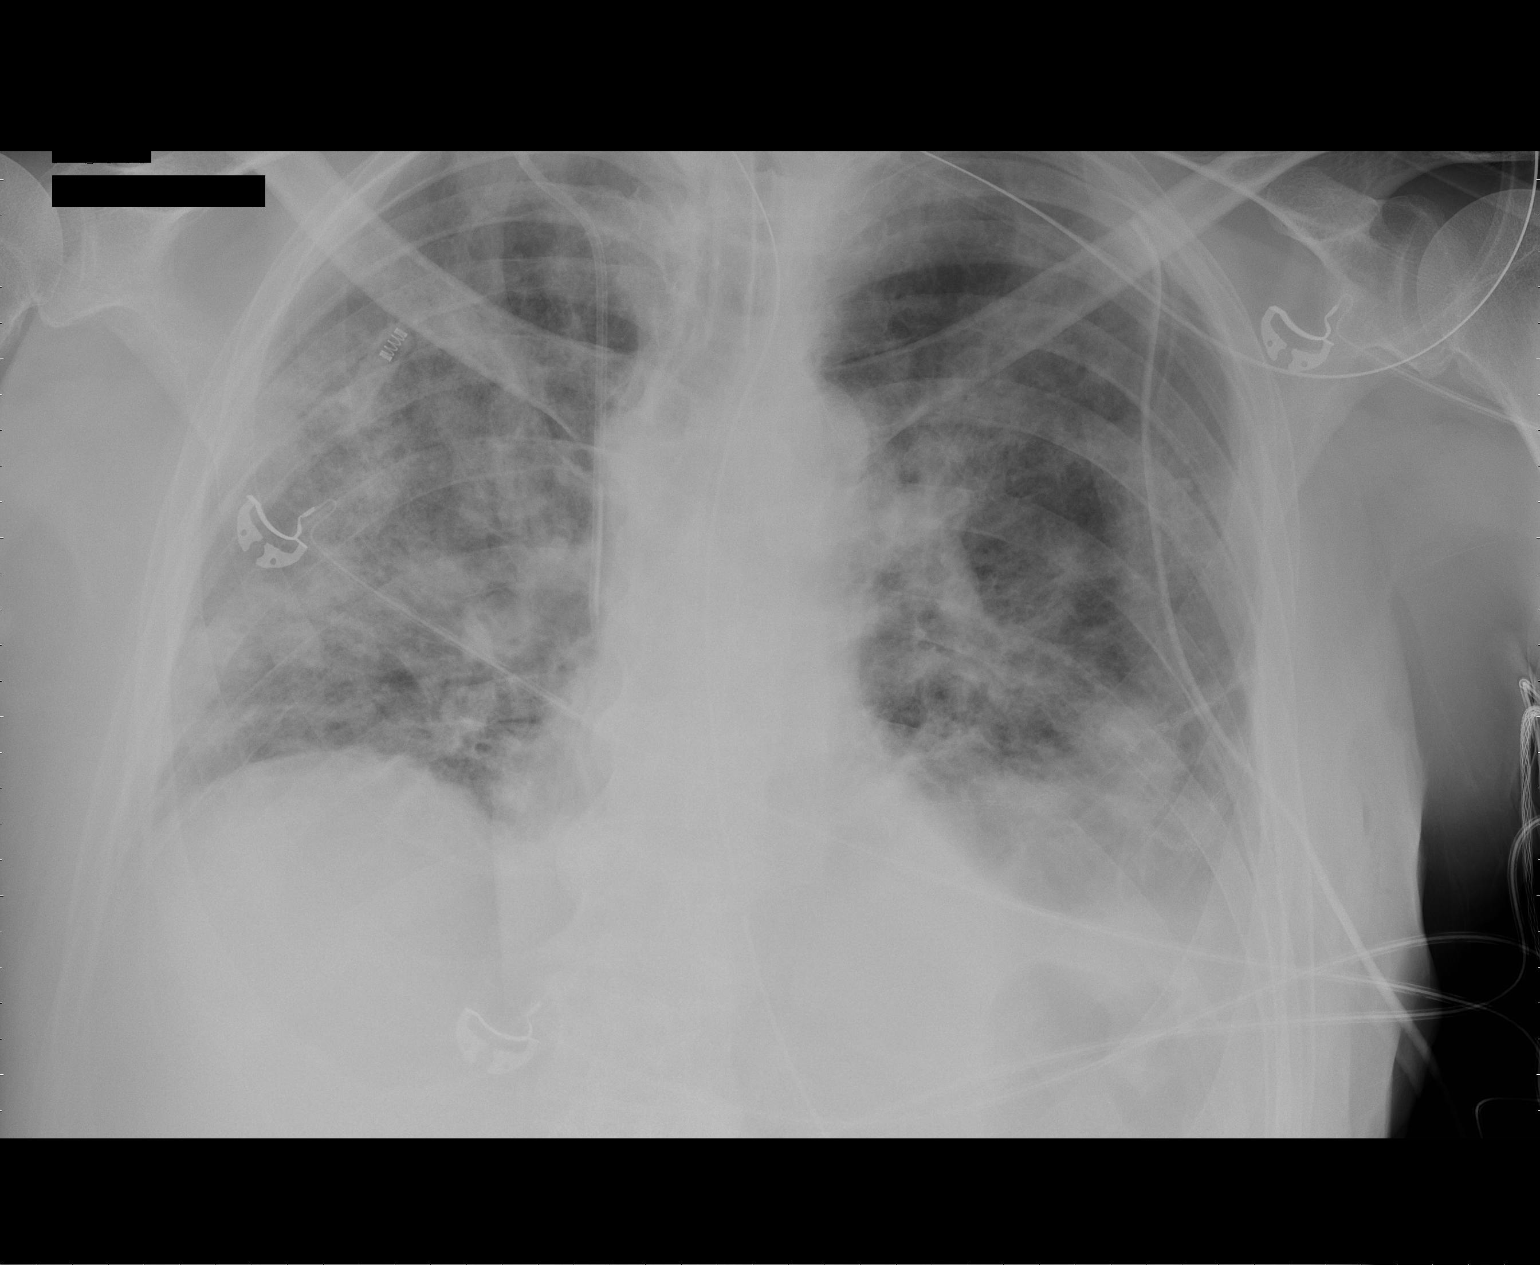

[1 of 1 positions shown; findings below may reference images not displayed]

FINDINGS: The endotracheal tube is satisfactory position, 4.5 cm
above the carina.  Right IJ central venous catheter terminates in
the mid to distal superior vena cava.  The nasogastric tube
terminates in the proximal stomach.  The side port of the
nasogastric tube is in the expected location of the
gastroesophageal junction; consider advancing several centimeters.

The left-sided chest tube terminates laterally, near the left lung
apex.  Surgical suture is seen in the lateral left lung base.

The lung volumes are low bilaterally.  Nodular bilateral airspace
opacities are similar to recent prior chest radiographs.  No
visible pneumothorax.  Left costophrenic angle is blunted; cannot
exclude a left pleural effusion.  A small locule of subcutaneous
gas is seen in the left chest wall.
IMPRESSION: 1.  Satisfactory position of endotracheal tube and right IJ central
venous catheter.  Left chest tube terminates near the left lung
apex.
2.  Postsurgical changes in the left lung base.  No visible
pneumothorax.
3.  Nasogastric tube terminates in the proximal stomach.  Consider
advancing several centimeters.
4.  No significant change in bilateral nodular airspace opacities.
Cannot exclude small left pleural effusion.

## 2013-04-18 IMAGING — CR DG CHEST 2V
2 series · 2 of 2 positions shown · non-contrast
Comparison: Chest radiograph 11/17/2011

CLINICAL DATA: Pulmonary nodules, preop VATS

CHEST - 2 VIEW

[w chest pa]
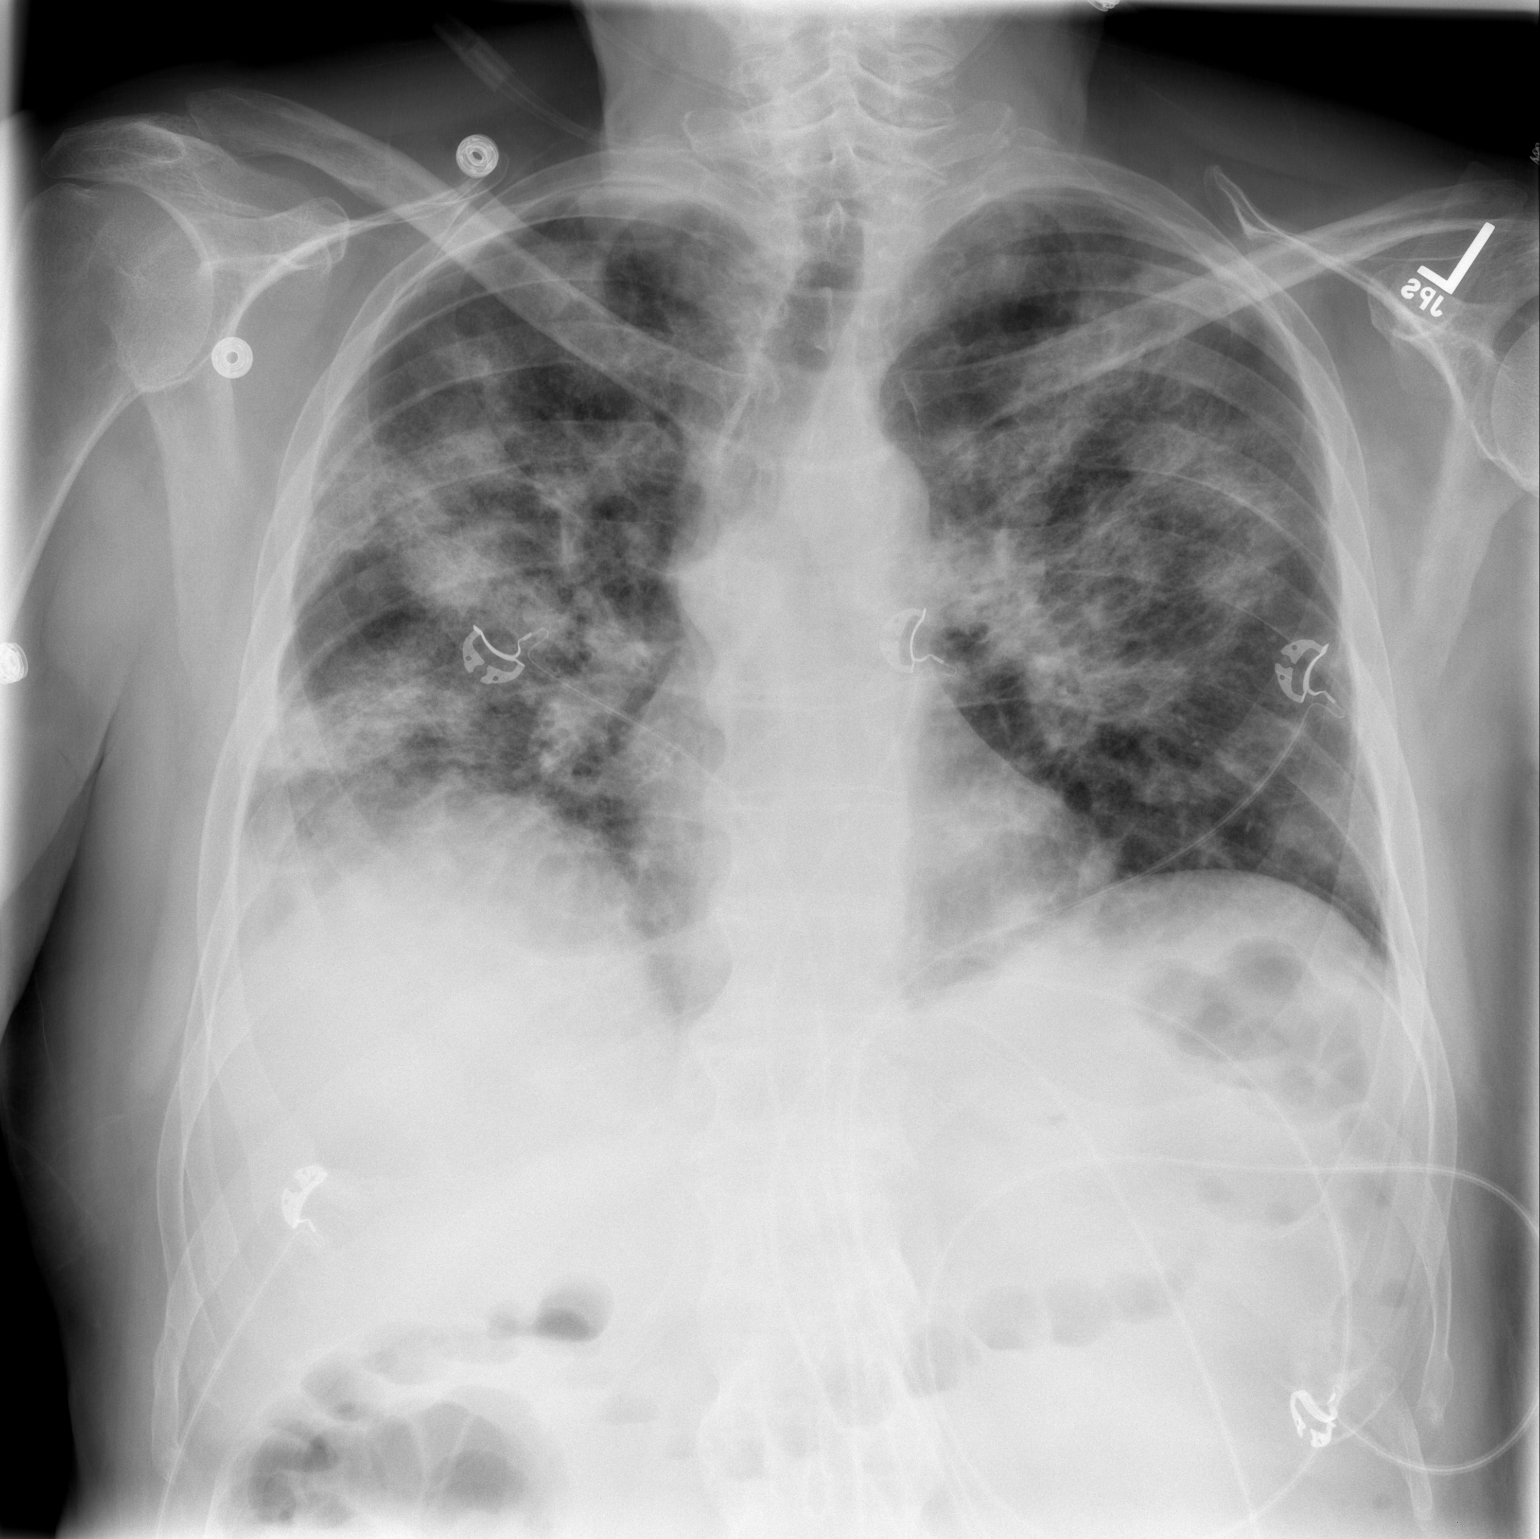

[w chest lat]
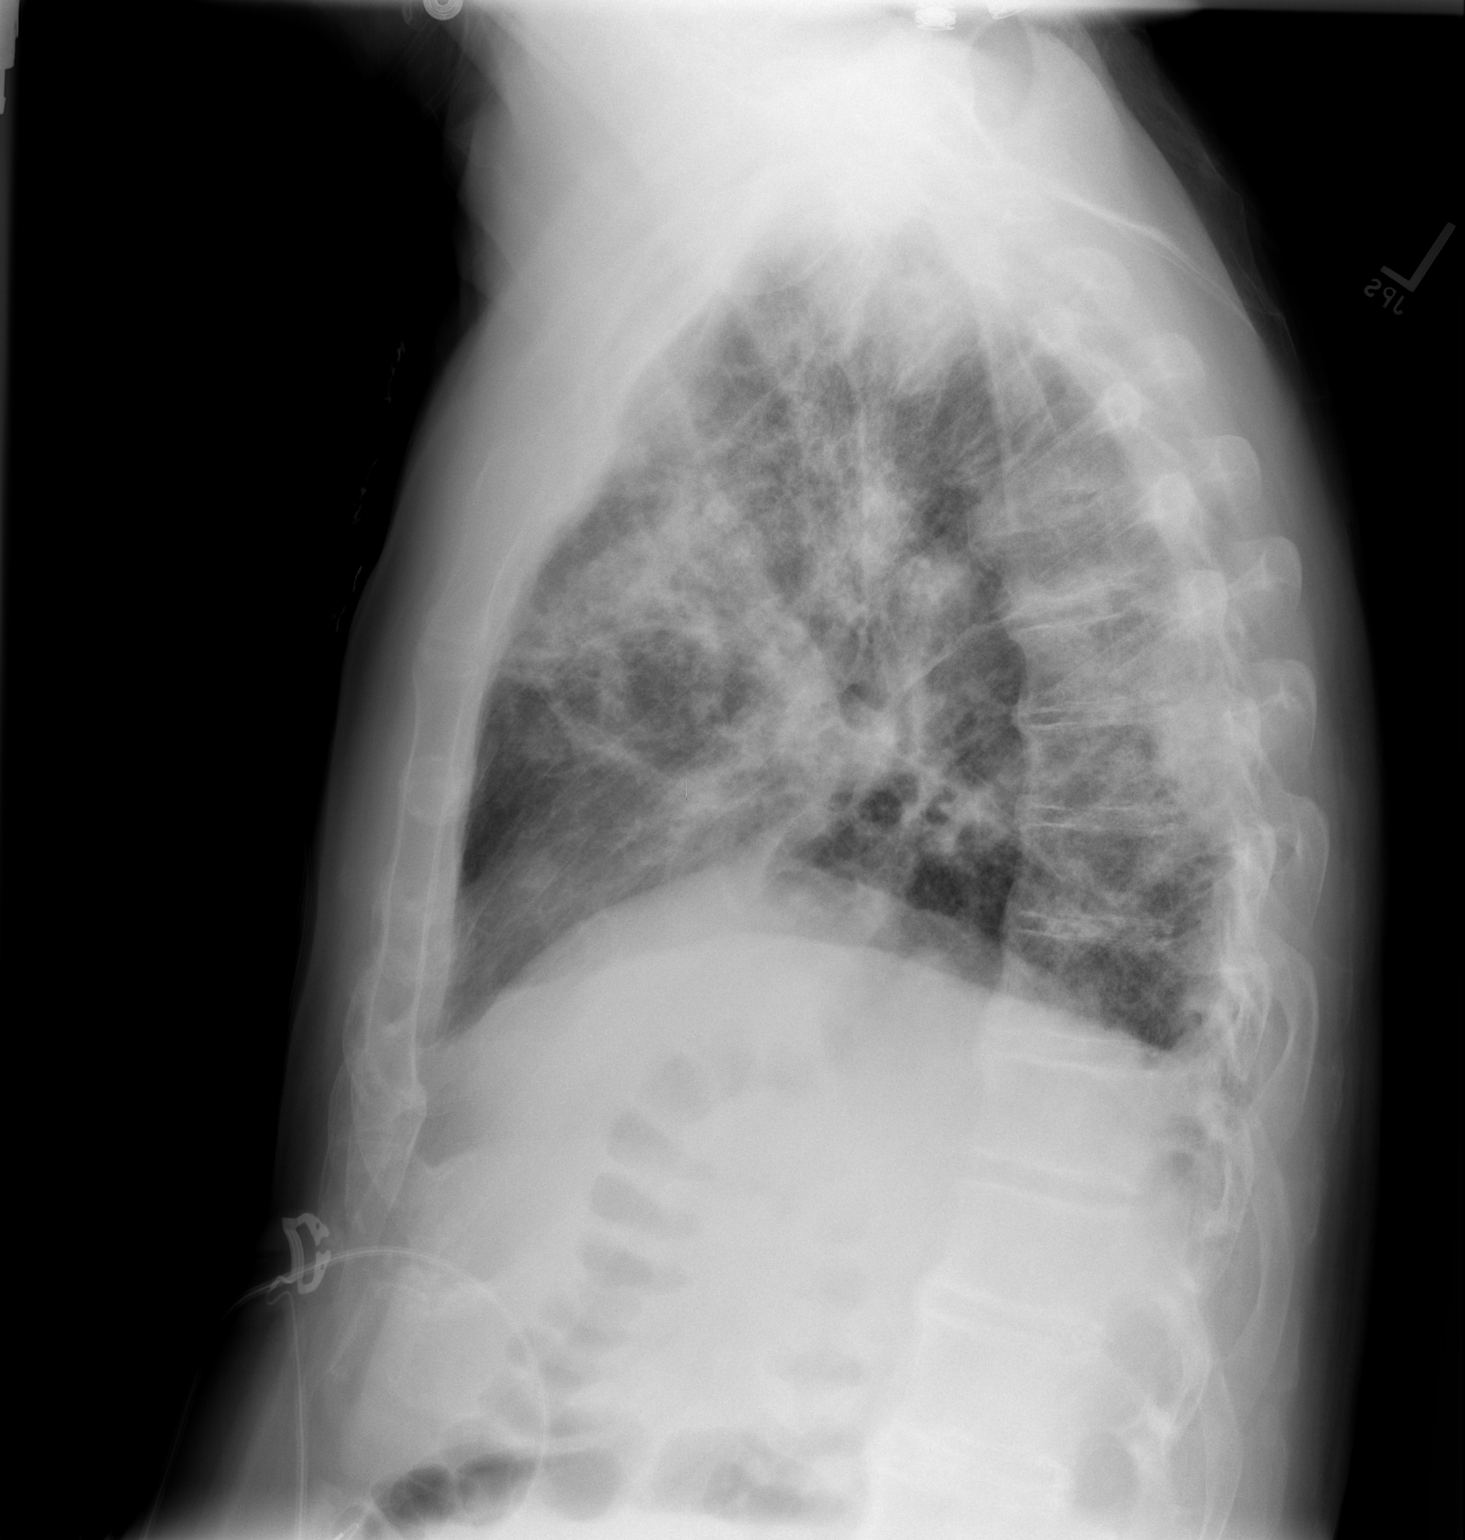

[2 of 2 positions shown; findings below may reference images not displayed]

FINDINGS: There are bilateral nodular air space densities
unchanged.  No pneumothorax.  Right effusion.
IMPRESSION: 1.  No interval change.
2.  Nodular air space opacities.

3. Right pleural effusion

## 2013-04-19 IMAGING — CR DG CHEST 1V PORT
1 series · 1 of 1 positions shown · non-contrast
Comparison: 11/28/2011; 11/27/2011; 11/26/2011

CLINICAL DATA: Evaluate endotracheal tube

PORTABLE CHEST - 1 VIEW

[view not recorded]
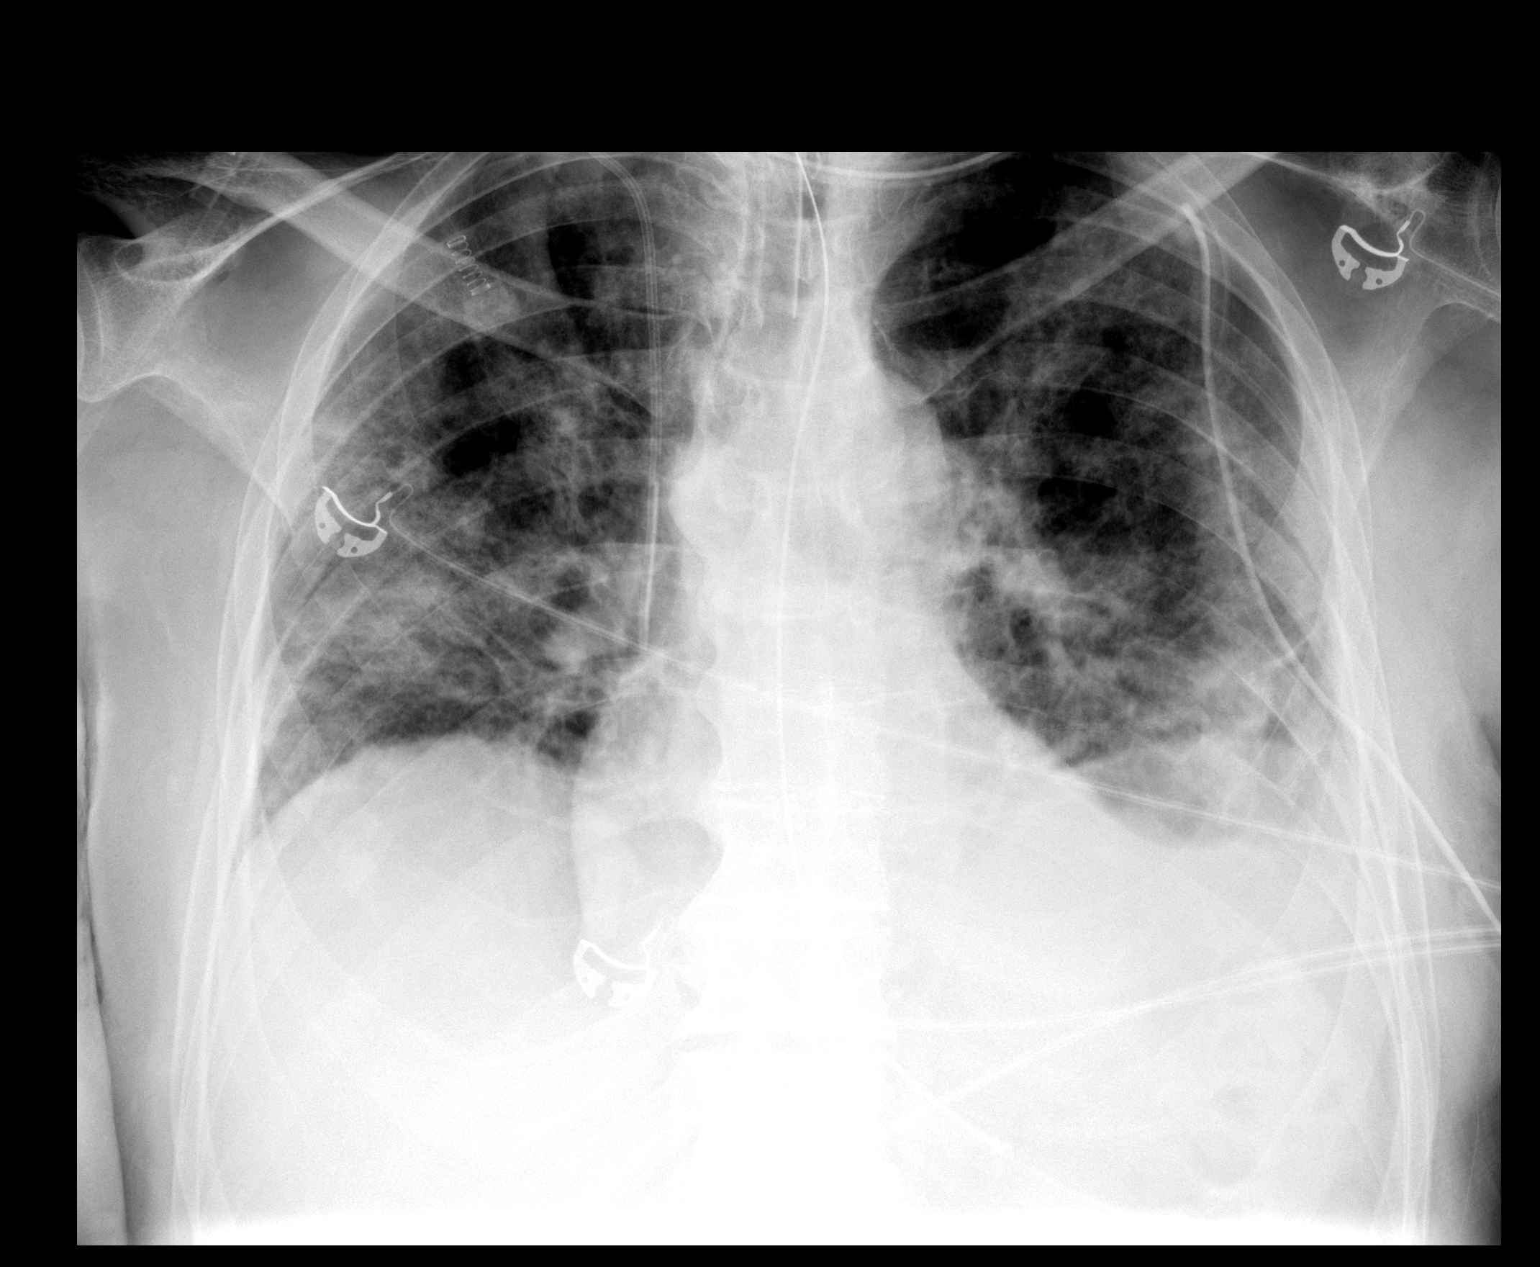

[1 of 1 positions shown; findings below may reference images not displayed]

FINDINGS: Grossly unchanged cardiac silhouette and mediastinal
contours. Stable positioning of the support apparatus including
sideport of enteric tube regional to the gastroesophageal junction.
No pneumothorax.  Grossly it has changed extensive bilateral
heterogeneous air space opacities.  Likely unchanged small left-
sided effusion.  Grossly unchanged bones..
IMPRESSION: 1.  Stable positioning of support apparatus, including side port of
the enteric tube return to the gastroesophageal junction.
Advancement at least 10 cm is recommended.  No pneumothorax.

2. Grossly unchanged extensive bilateral heterogeneous opacities
worrisome for multifocal infection.

## 2014-08-11 ENCOUNTER — Other Ambulatory Visit: Payer: Self-pay | Admitting: Dermatology

## 2015-09-17 ENCOUNTER — Ambulatory Visit (INDEPENDENT_AMBULATORY_CARE_PROVIDER_SITE_OTHER): Payer: Medicare Other | Admitting: Neurology

## 2015-09-17 ENCOUNTER — Encounter: Payer: Self-pay | Admitting: Neurology

## 2015-09-17 VITALS — BP 100/62 | HR 88 | Resp 20 | Ht 74.0 in | Wt 173.0 lb

## 2015-09-17 DIAGNOSIS — E538 Deficiency of other specified B group vitamins: Secondary | ICD-10-CM | POA: Diagnosis not present

## 2015-09-17 DIAGNOSIS — R413 Other amnesia: Secondary | ICD-10-CM | POA: Insufficient documentation

## 2015-09-17 DIAGNOSIS — R269 Unspecified abnormalities of gait and mobility: Secondary | ICD-10-CM | POA: Diagnosis not present

## 2015-09-17 DIAGNOSIS — R5382 Chronic fatigue, unspecified: Secondary | ICD-10-CM

## 2015-09-17 HISTORY — DX: Unspecified abnormalities of gait and mobility: R26.9

## 2015-09-17 HISTORY — DX: Other amnesia: R41.3

## 2015-09-17 NOTE — Patient Instructions (Signed)
Fall Prevention in the Home  Falls can cause injuries and can affect people from all age groups. There are many simple things that you can do to make your home safe and to help prevent falls. WHAT CAN I DO ON THE OUTSIDE OF MY HOME?  Regularly repair the edges of walkways and driveways and fix any cracks.  Remove high doorway thresholds.  Trim any shrubbery on the main path into your home.  Use bright outdoor lighting.  Clear walkways of debris and clutter, including tools and rocks.  Regularly check that handrails are securely fastened and in good repair. Both sides of any steps should have handrails.  Install guardrails along the edges of any raised decks or porches.  Have leaves, snow, and ice cleared regularly.  Use sand or salt on walkways during winter months.  In the garage, clean up any spills right away, including grease or oil spills. WHAT CAN I DO IN THE BATHROOM?  Use night lights.  Install grab bars by the toilet and in the tub and shower. Do not use towel bars as grab bars.  Use non-skid mats or decals on the floor of the tub or shower.  If you need to sit down while you are in the shower, use a plastic, non-slip stool..  Keep the floor dry. Immediately clean up any water that spills on the floor.  Remove soap buildup in the tub or shower on a regular basis.  Attach bath mats securely with double-sided non-slip rug tape.  Remove throw rugs and other tripping hazards from the floor. WHAT CAN I DO IN THE BEDROOM?  Use night lights.  Make sure that a bedside light is easy to reach.  Do not use oversized bedding that drapes onto the floor.  Have a firm chair that has side arms to use for getting dressed.  Remove throw rugs and other tripping hazards from the floor. WHAT CAN I DO IN THE KITCHEN?   Clean up any spills right away.  Avoid walking on wet floors.  Place frequently used items in easy-to-reach places.  If you need to reach for something  above you, use a sturdy step stool that has a grab bar.  Keep electrical cables out of the way.  Do not use floor polish or wax that makes floors slippery. If you have to use wax, make sure that it is non-skid floor wax.  Remove throw rugs and other tripping hazards from the floor. WHAT CAN I DO IN THE STAIRWAYS?  Do not leave any items on the stairs.  Make sure that there are handrails on both sides of the stairs. Fix handrails that are broken or loose. Make sure that handrails are as long as the stairways.  Check any carpeting to make sure that it is firmly attached to the stairs. Fix any carpet that is loose or worn.  Avoid having throw rugs at the top or bottom of stairways, or secure the rugs with carpet tape to prevent them from moving.  Make sure that you have a light switch at the top of the stairs and the bottom of the stairs. If you do not have them, have them installed. WHAT ARE SOME OTHER FALL PREVENTION TIPS?  Wear closed-toe shoes that fit well and support your feet. Wear shoes that have rubber soles or low heels.  When you use a stepladder, make sure that it is completely opened and that the sides are firmly locked. Have someone hold the ladder while you   are using it. Do not climb a closed stepladder.  Add color or contrast paint or tape to grab bars and handrails in your home. Place contrasting color strips on the first and last steps.  Use mobility aids as needed, such as canes, walkers, scooters, and crutches.  Turn on lights if it is dark. Replace any light bulbs that burn out.  Set up furniture so that there are clear paths. Keep the furniture in the same spot.  Fix any uneven floor surfaces.  Choose a carpet design that does not hide the edge of steps of a stairway.  Be aware of any and all pets.  Review your medicines with your healthcare provider. Some medicines can cause dizziness or changes in blood pressure, which increase your risk of falling. Talk  with your health care provider about other ways that you can decrease your risk of falls. This may include working with a physical therapist or trainer to improve your strength, balance, and endurance.   This information is not intended to replace advice given to you by your health care provider. Make sure you discuss any questions you have with your health care provider.   Document Released: 06/20/2002 Document Revised: 11/14/2014 Document Reviewed: 08/04/2014 [employer] Interactive Patient Education 2016 [employer]  

## 2015-09-17 NOTE — Progress Notes (Signed)
Reason for visit: Gait disorder  Referring physician: Dr. [name] is a 75 y.o. male  History of present illness:  Robert Mcgee is a 75 year old right-handed white male with a history of Wegener's granulomatosis, treated with Imuran and rituximab in the past. The patient began having some problems with memory issues about a year ago, this has gradually worsened over time. The patient has had a concurrent worsening of his shortness of breath. He is limited to some degree with what he can do during the day because of his shortness of breath. The patient has had an alteration in his ability to walk, he has more difficulty getting up out of a chair, he will shuffle his feet, as a stooped posture, and has some gait instability. He has not had any falls. The patient denies any numbness of the extremities, there is some question whether or not he is weak in general. The patient has not had any significant changes in bowel or bladder control. He has had some change in speech, his speech is somewhat garbled, lower in amplitude. He has not had definite tremors. He has had episodes where he may wake up at night, and talk about people being in the house. There has been a general change in his personality. There is some mild problem with agitation at times. The patient does have some sleep apnea, on an oral device. The patient has had a significant weight loss issue. He has restless legs. He is sent to this office for an evaluation. MRI of the brain was done at [facility] in June 2016. He mainly had memory issues at that time. The walking problems have occurred since that point. The patient will be seeing a pulmonologist in the near future.  Past Medical History  Diagnosis Date  . Kidney stone   . Pneumothorax   . Lung nodules   . Hx of Wegener's granulomatosis 11/2011    auto immune disease / attacked his small blood vessels in lungs and joints  . Adenomatous colon polyp   . Personal history of  colonic adenoma 08/20/2012    2008 - 5 mm tubular adenoma 08/20/2012    . Memory disturbance 09/17/2015  . Abnormality of gait 09/17/2015    Past Surgical History  Procedure Laterality Date  . Varicose vein surgery    . Video assisted thoracoscopy  11/28/2011    Procedure: VIDEO ASSISTED THORACOSCOPY;  Surgeon: Ivin Poot, MD;  Location: [place];  Service: Thoracic;  Laterality: Left;  Would like to follow first case  . Lung biopsy  11/28/2011    Procedure: LUNG BIOPSY;  Surgeon: Ivin Poot, MD;  Location: [facility] OR;  Service: Thoracic;  Laterality: Left;    Family History  Problem Relation Age of Onset  . Melanoma Mother   . Lung cancer Father   . Heart disease Brother     Social history:  reports that he quit smoking about 34 years ago. He has never used smokeless tobacco. He reports that he does not drink alcohol or use illicit drugs.  Medications:  Prior to Admission medications   Medication Sig Start Date End Date Taking? Authorizing Provider  acetaminophen (TYLENOL) 500 MG tablet Take 500 mg by mouth every 6 (six) hours as needed. Pain   Yes Historical Provider, MD  B Complex-C (B-COMPLEX WITH VITAMIN C) tablet Take 1 tablet by mouth daily.   Yes Historical Provider, MD  calcium-vitamin D (OSCAL WITH D) 500-200 MG-UNIT per tablet  Take 1 tablet by mouth 2 (two) times daily.   Yes Historical Provider, MD  ibuprofen (ADVIL,MOTRIN) 200 MG tablet Take 200 mg by mouth every 6 (six) hours as needed. pain   Yes Historical Provider, MD  Multiple Vitamins-Minerals (MULTIVITAMIN WITH MINERALS) tablet Take 1 tablet by mouth daily.   Yes Historical Provider, MD  RiTUXimab (RITUXAN IV) Inject into the vein. Chemo drug   Yes Historical Provider, MD  tamsulosin (FLOMAX) 0.4 MG CAPS capsule Take 0.4 mg by mouth daily. 09/14/15  Yes Historical Provider, MD     No Known Allergies  ROS:  Out of a complete 14 system review of symptoms, the patient complains only of the following symptoms, and all  other reviewed systems are negative.  Weight loss, fatigue Hearing loss Shortness of breath, snoring Constipation Incontinence Muscle cramps Memory loss, confusion, slurred speech Decreased energy, change in appetite, hallucinations Restless legs  Blood pressure 100/62, pulse 88, resp. rate 20, height 6\' 2"  (1.88 m), weight 173 lb (78.472 kg).  Physical Exam  General: The patient is alert and cooperative at the time of the examination.  Eyes: Pupils are equal, round, and reactive to light. Discs are flat bilaterally.  Neck: The neck is supple, no carotid bruits are noted.  Respiratory: The respiratory examination is clear.  Cardiovascular: The cardiovascular examination reveals a regular rate and rhythm, no obvious murmurs or rubs are noted.  Skin: Extremities are without significant edema.  Neurologic Exam  Mental status: The patient is alert and oriented x 3 at the time of the examination. The Mini-Mental Status Examination done today shows a total score of 18/30. The patient is able to name 9 animals in 30 seconds.  Cranial nerves: Facial symmetry is present. There is good sensation of the face to pinprick and soft touch bilaterally. The strength of the facial muscles and the muscles to head turning and shoulder shrug are normal bilaterally. Speech is mildly dysarthric. Extraocular movements are full. Visual fields are full. The tongue is midline, and the patient has symmetric elevation of the soft palate. No obvious hearing deficits are noted.  Motor: The motor testing reveals 5 over 5 strength of all 4 extremities. Good symmetric motor tone is noted throughout.  Sensory: Sensory testing is intact to pinprick, soft touch, vibration sensation, and position sense on all 4 extremities, with exception of a pinprick sensory deficit across the ankles bilaterally. No evidence of extinction is noted.  Coordination: Cerebellar testing reveals good finger-nose-finger and heel-to-shin  bilaterally.  Gait and station: Gait is normal, but the foster somewhat stooped. The patient has good arm swing bilaterally, good stride. Tandem gait is normal. Romberg is negative. No drift is seen.  Reflexes: Deep tendon reflexes are symmetric, but are depressed bilaterally. Toes are downgoing bilaterally.   Assessment/Plan:  1. Memory disturbance  2. 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 Wegener's granulomatosis  The patient does have some mild features of parkinsonism, and the diagnosis of Parkinson's disease does need to be considered, versus Lewy body dementia. The patient is immunosuppressed on Imuran and rituximab, MRI of the brain done in June 2016 was relatively unremarkable, but the patient has progressed significantly since that time. I will repeat the study, check blood work today. The past, the patient has been on Aricept but he did not continue the medication as he believed that it was not helping him. The shortness of breath has significantly worsened, and this may be resulting in some limitation of his ability to perform  physical activities. He will be seeing a pulmonologist in the near future. There is no clear evidence of neuromuscular weakness on clinical examination. 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  Robert Alexanders MD 09/17/2015 7:31 PM  [facility] [address] [place], [place] [address]  Phone [phone] Fax [phone]

## 2015-09-18 ENCOUNTER — Other Ambulatory Visit: Payer: Self-pay | Admitting: Neurology

## 2015-09-18 DIAGNOSIS — T1590XA Foreign body on external eye, part unspecified, unspecified eye, initial encounter: Secondary | ICD-10-CM

## 2015-09-19 ENCOUNTER — Telehealth: Payer: Self-pay

## 2015-09-19 LAB — COPPER, SERUM: COPPER: 103 ug/dL (ref 72–166)

## 2015-09-19 LAB — CK: Total CK: 100 U/L (ref 24–204)

## 2015-09-19 LAB — ACETYLCHOLINE RECEPTOR, BINDING

## 2015-09-19 LAB — TSH: TSH: 2.92 u[IU]/mL (ref 0.450–4.500)

## 2015-09-19 LAB — ANGIOTENSIN CONVERTING ENZYME: ANGIO CONVERT ENZYME: 16 U/L (ref 14–82)

## 2015-09-19 LAB — SEDIMENTATION RATE: SED RATE: 7 mm/h (ref 0–30)

## 2015-09-19 LAB — VITAMIN B12: Vitamin B-12: 904 pg/mL (ref 211–946)

## 2015-09-19 LAB — RPR: RPR Ser Ql: NONREACTIVE

## 2015-09-19 LAB — ANA W/REFLEX: ANA: NEGATIVE

## 2015-09-19 NOTE — Telephone Encounter (Signed)
I advised pt's wife that pt's blood work results are unremarkable. I advised pt's wife that pt should proceed with MRI brain as ordered by Dr. Jannifer Franklin and we will call with those results when we receive them. Pt's wife verbalized understanding.

## 2015-09-19 NOTE — Telephone Encounter (Signed)
-----   Message from Kathrynn Ducking, MD sent at 09/19/2015  7:30 AM EST -----  The blood work results are unremarkable. Please call the patient.  ----- Message -----    From: [facility] Lab Results In Interface    Sent: 09/18/2015   7:41 AM      To: Kathrynn Ducking, MD

## 2015-09-26 ENCOUNTER — Ambulatory Visit
Admission: RE | Admit: 2015-09-26 | Discharge: 2015-09-26 | Disposition: A | Discharge status: Home / Self Care | Payer: Medicare Other | Source: Ambulatory Visit | Attending: Neurology | Admitting: Neurology

## 2015-09-26 ENCOUNTER — Telehealth: Payer: Self-pay | Admitting: Neurology

## 2015-09-26 DIAGNOSIS — R269 Unspecified abnormalities of gait and mobility: Secondary | ICD-10-CM

## 2015-09-26 DIAGNOSIS — R413 Other amnesia: Secondary | ICD-10-CM

## 2015-09-26 DIAGNOSIS — T1590XA Foreign body on external eye, part unspecified, unspecified eye, initial encounter: Secondary | ICD-10-CM

## 2015-09-26 NOTE — Telephone Encounter (Signed)
I called and left a message. The patient had an MRI last summer that he was apparently able to get through. We can give alprazolam for the MRI if this would help. If they do not believe they can get through the scan, we will have to settle for a CT of the head. They let me know if they want to try again for the MRI.

## 2015-09-26 NOTE — Telephone Encounter (Signed)
Pt went for MRI this morning but he was unable to complete it. He has trouble breathing when lying down, he has tremors, and restless leg. She is inquiring if there is another way the pt could have MRI.

## 2015-09-27 NOTE — Addendum Note (Signed)
Addended by: Margette Fast on: 09/27/2015 10:07 AM   Modules accepted: Orders

## 2015-09-27 NOTE — Telephone Encounter (Signed)
Wife returned Dr. Jannifer Franklin' call, states husband did not have sedation with MRI in June, "if Dr. Jannifer Franklin feels this is the way to go (alprazolam) then that's what we'll do, however states husband's tremors and breathing is so much worse since June. Wife also states [place] mentioned that "maybe if they did this at the hospital, maybe they could give him sedation". Please call [phone] to advise.

## 2015-09-27 NOTE — Telephone Encounter (Signed)
The major issue with the scan is that the patient is much worse with breathing, he has oxygen now. He is unable to lay flat. I will go for the CT scan rather than MRI because the time required is much less. I will cancel the MRI.

## 2015-10-02 ENCOUNTER — Ambulatory Visit
Admission: RE | Admit: 2015-10-02 | Discharge: 2015-10-02 | Disposition: A | Discharge status: Home / Self Care | Payer: Medicare Other | Source: Ambulatory Visit | Attending: Neurology | Admitting: Neurology

## 2015-10-02 DIAGNOSIS — R269 Unspecified abnormalities of gait and mobility: Secondary | ICD-10-CM | POA: Diagnosis not present

## 2015-10-02 DIAGNOSIS — R413 Other amnesia: Secondary | ICD-10-CM | POA: Diagnosis not present

## 2015-10-04 ENCOUNTER — Telehealth: Payer: Self-pay | Admitting: Neurology

## 2015-10-04 MED ORDER — MEMANTINE HCL 28 X 5 MG & 21 X 10 MG PO TABS: ORAL_TABLET | ORAL | Status: AC

## 2015-10-04 NOTE — Telephone Encounter (Signed)
  I called the patient, talk with the wife. The patient has no real changes on CT the brain compared to the MRI done previously. The patient cannot tolerate MRI scan because he cannot lie down because of his breathing issues. The reading problems have progressed rapidly. The patient has features of parkinsonism and a rapidly progressive dementia. He could have Lewy body disease. He is hallucinating. I'll get him started on Namenda. He was on Aricept, but he has lost a lot of weight, best that he stay off the drug. The wife has asked for a handicap parking sticker.   CT head 10/04/15:  IMPRESSION: This CT scan of the head without contrast shows the following: 1. Moderate cortical atrophy that is most pronounced in the mesial temporal lobes. This appears stable compared to the MRI dated 12/29/2014. 2. Mild hypodense changes in the hemispheres consistent with chronic microvascular ischemic change. This appears stable compared to the prior MRI. 3. There are no acute findings.

## 2015-10-05 ENCOUNTER — Telehealth: Payer: Self-pay | Admitting: *Deleted

## 2015-10-05 NOTE — Telephone Encounter (Addendum)
Will pick up today prior to 1200.  Placed up front.   Also stated about PA? Needed for namenda.  To call [facility].  Wife stated that had tried aricept, but weight loss an issue, so to try namenda.

## 2015-10-09 NOTE — Telephone Encounter (Signed)
Received PA request form.

## 2015-10-12 ENCOUNTER — Telehealth: Payer: Self-pay | Admitting: Neurology

## 2015-10-12 NOTE — Telephone Encounter (Signed)
[facility] [phone] Predetermination Dept  memantine (NAMENDA TITRATION PAK) tablet pack if pt has moderate/severe alzheimer or mild/moderate vascular dementia

## 2015-10-12 NOTE — Telephone Encounter (Signed)
Initiated PA for namenda thru [facility].

## 2015-10-15 ENCOUNTER — Encounter: Payer: Self-pay | Admitting: Neurology

## 2015-10-15 NOTE — Telephone Encounter (Signed)
I will write a letter.

## 2015-10-15 NOTE — Telephone Encounter (Addendum)
Spoke to [place] with [facility].  Denied namenda.  Need appeal letter.  Fax to [phone].

## 2015-10-15 NOTE — Telephone Encounter (Signed)
This patient is scoring 18/30 on Mini-Mental status examination, therefore he has a moderate level Alzheimer's disease process. He does qualify for the use of Namenda.

## 2015-10-16 NOTE — Telephone Encounter (Signed)
I spoke to [place].  I relayed that per Dr. 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  Has been on aricept (due to SE weight loss ) this was not choice per Dr. Jannifer Franklin.  This will be sent to clinical team and they will get back to [place].

## 2015-10-16 NOTE — Telephone Encounter (Signed)
I faxed letter to [facility] [phone] (received confirmation).

## 2015-10-16 NOTE — Telephone Encounter (Signed)
Eddie Dibbles with [employer] from the appeal dept regarding medication Namenda. They need to know the diagnosis of the pt. Does the pt actually have alzheimer's? They also need to know what medications pt has tried and failed- they need two different medications. May call [phone], Eddie Dibbles. May leave message

## 2015-10-16 NOTE — Telephone Encounter (Signed)
Tyhee [facility] [phone] appeals dept called, rec'd standard part D appeal, this does have a 7 day turn around time. She will be sending this to an analyst. (f) [phone] please fax any supporting documents.  FYI only per Larene [place]

## 2015-10-16 NOTE — Telephone Encounter (Signed)
Robert Mcgee called back.  Another PA need for memantine for pt.  (which is formulary).  Original PA done for BN.  Will resubmit another PA.

## 2015-10-17 NOTE — Telephone Encounter (Signed)
I called wife of pt.  Thru their [place], they were able to get 5mg  memantine tabs and is taking titration for this as perscribed.  Is on week one at this time.  Is at the [facility] pulmonary appt now.  I spoke to [place] at [place] and cancelled the PA for generic memantine, since pt received from the pharmacy and is taking drug at this time.

## 2015-10-17 NOTE — Telephone Encounter (Signed)
[employer] is needing clinical information for the processing of memantine (NAMENDA TITRATION PAK) tablet pack . Please call [phone], anyone will be able to help.

## 2015-10-24 ENCOUNTER — Encounter: Payer: Self-pay | Admitting: Internal Medicine

## 2015-11-12 DEATH — deceased

## 2016-02-08 ENCOUNTER — Ambulatory Visit: Payer: Medicare Other | Admitting: Neurology
